# Patient Record
Sex: Female | Born: 1965 | Race: White | Hispanic: No | Smoking: Former smoker
Health system: Southern US, Community
[De-identification: ages and names within clinical notes are randomized; demographics above are authoritative.]

## PROBLEM LIST (undated history)

## (undated) DIAGNOSIS — I10 Essential (primary) hypertension: Secondary | ICD-10-CM

## (undated) DIAGNOSIS — F99 Mental disorder, not otherwise specified: Secondary | ICD-10-CM

## (undated) DIAGNOSIS — F320 Major depressive disorder, single episode, mild: Secondary | ICD-10-CM

## (undated) DIAGNOSIS — F419 Anxiety disorder, unspecified: Principal | ICD-10-CM

## (undated) HISTORY — DX: Major depressive disorder, single episode, mild: F32.0

## (undated) HISTORY — DX: Mental disorder, not otherwise specified: F99

## (undated) HISTORY — DX: Anxiety disorder, unspecified: F41.9

---

## 2000-11-14 ENCOUNTER — Other Ambulatory Visit: Admission: RE | Admit: 2000-11-14 | Discharge: 2000-11-14 | Payer: Self-pay | Admitting: Obstetrics and Gynecology

## 2003-10-26 ENCOUNTER — Ambulatory Visit (HOSPITAL_COMMUNITY): Admission: RE | Admit: 2003-10-26 | Discharge: 2003-10-26 | Payer: Self-pay | Admitting: Family Medicine

## 2005-01-23 ENCOUNTER — Inpatient Hospital Stay (HOSPITAL_COMMUNITY): Admission: RE | Admit: 2005-01-23 | Discharge: 2005-01-24 | Payer: Self-pay | Admitting: Obstetrics and Gynecology

## 2006-09-18 ENCOUNTER — Ambulatory Visit (HOSPITAL_COMMUNITY): Admission: RE | Admit: 2006-09-18 | Discharge: 2006-09-18 | Payer: Self-pay | Admitting: Obstetrics and Gynecology

## 2006-10-31 ENCOUNTER — Ambulatory Visit (HOSPITAL_COMMUNITY): Admission: RE | Admit: 2006-10-31 | Discharge: 2006-10-31 | Payer: Self-pay | Admitting: Obstetrics and Gynecology

## 2007-11-25 ENCOUNTER — Other Ambulatory Visit: Admission: RE | Admit: 2007-11-25 | Discharge: 2007-11-25 | Payer: Self-pay | Admitting: Family

## 2008-01-01 ENCOUNTER — Ambulatory Visit (HOSPITAL_COMMUNITY): Admission: RE | Admit: 2008-01-01 | Discharge: 2008-01-01 | Payer: Self-pay | Admitting: Obstetrics and Gynecology

## 2008-02-07 ENCOUNTER — Ambulatory Visit (HOSPITAL_COMMUNITY): Admission: RE | Admit: 2008-02-07 | Discharge: 2008-02-07 | Payer: Self-pay | Admitting: Family Medicine

## 2009-12-21 ENCOUNTER — Ambulatory Visit (HOSPITAL_COMMUNITY): Admission: RE | Admit: 2009-12-21 | Discharge: 2009-12-21 | Payer: Self-pay | Admitting: Obstetrics and Gynecology

## 2010-01-24 ENCOUNTER — Ambulatory Visit: Payer: Self-pay | Admitting: Orthopedic Surgery

## 2010-01-24 DIAGNOSIS — S93409A Sprain of unspecified ligament of unspecified ankle, initial encounter: Secondary | ICD-10-CM | POA: Insufficient documentation

## 2010-02-14 ENCOUNTER — Ambulatory Visit: Payer: Self-pay | Admitting: Orthopedic Surgery

## 2010-07-26 NOTE — Assessment & Plan Note (Signed)
Summary: ANKLE INJURY/NEEDS XRAY/CAF   Vital Signs:  Patient profile:   45 year old female Height:      67 inches Weight:      175 pounds Pulse rate:   80 / minute Resp:     16 per minute  Vitals Entered By: Fuller Canada MD (January 24, 2010 9:16 AM)  Visit Type:  new patient Referring Provider:  self Primary Provider:  Dorthey Sawyer  CC:  right ankle pain.  History of Present Illness: I saw Diamond [name] in the office today for an initial visit.  She is a 45 years old woman with the complaint of:  right ankle pain.  DOI 01/20/10.  Xrays today in our office.  Meds: Lexapro.  This is a 45 year old female who slid or fell down some steps on July 28 and twisted her RIGHT ankle.  She complains of severe pain which is constant sharp associated with significant amount of swelling and bruising over the foot.  She has pain numbness and tingling.  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 Cardiovascular:  Denies chest pain, palpitations, fainting, and murmurs. Respiratory:  Denies short of breath, wheezing, couch, tightness, pain on inspiration, and snoring . Gastrointestinal:  Denies heartburn, nausea, vomiting, diarrhea, constipation, and blood in your stools. Genitourinary:  Denies frequency, urgency, difficulty urinating, painful urination, flank pain, and bleeding in urine. Neurologic:  Denies numbness, tingling, unsteady gait, dizziness, tremors, and seizure. Musculoskeletal:  Denies joint pain, swelling, instability, stiffness, redness, heat, and muscle  pain. Endocrine:  Denies excessive thirst, exessive urination, and heat or cold intolerance. Psychiatric:  Denies nervousness, depression, anxiety, and hallucinations. Skin:  Denies changes in the skin, poor healing, rash, itching, and redness. HEENT:  Complains of blurred or double vision; denies eye pain, redness, and watering. Immunology:  Complains of seasonal allergies; denies sinus problems and allergic to bee stings. Hemoatologic:  Denies easy bleeding and brusing.  Physical Exam  Msk:  The patient is well developed and nourished, with normal grooming and hygiene. The body habitus is  normal Pulses:  pulses normal RIGHT foot Extremities:  this RIGHT foot is very swollen and there is significant ligament laxity anteriorly a medial lateral and pronation of the foot and supination of the foot.  Her primary tenderness is medial mild tenderness lateral foot and nontender fibula nontender proximally.  Passive range of motion less than 5 dorsiflexion 15 plantar flexion.  Weakness plantar flexion dorsiflexion.  Weakness to eversion inversion. Neurologic:  The coordination and sensation were normal  The reflexes were normal   Skin:  intact without lesions or rashes Psych:  alert and cooperative; normal mood and affect; normal attention span and concentration   Impression & Recommendations:  Problem # 1:  ANKLE SPRAIN (ICD-845.00) Assessment New  Orders: New Patient Level III (16109) Ankle x-ray complete,  minimum 3 views (60454) RIGHT  No fracture or subluxation dislocation is seen.  There are some small calcifications in the soft tissues around the lateral ankle but this does not appear to be acute.  There is some soft tissue swelling around the ankle joint.  Impression ankle  sprain  Medications Added to Medication List This Visit: 1)  Ibuprofen 800 Mg Tabs (Ibuprofen) .Marland Kitchen.. 1 by mouth three times a day 2)  Norco 5-325 Mg Tabs (Hydrocodone-acetaminophen) .Marland Kitchen.. 1 q 4 as needed  pain  Patient Instructions: 1)  diagnosis / ankle sprain 2)  Day 1-2  3)  apply ice for 20 minutes every 2 hours and keep the foot elevated and braced   4)  Use crutches keep your weight off your foot  5)  Day 3  6)  Try to put as much weight on your foot as possible in a brace;  7)  Wean yourself from the crutches. 8)  Move the foot up and down 25-50 times 3 -4x a day 9)  The brace should be worn for 3 weeks (if you are in a sport it should be worn for the rest of the season.  10)  Return in 3 weeks  Prescriptions: NORCO 5-325 MG TABS (HYDROCODONE-ACETAMINOPHEN) 1 q 4 as needed pain  #84 x 1   Entered and Authorized by:   Fuller Canada MD   Signed by:   Fuller Canada MD on 01/24/2010   Method used:   Print then Give to Patient   RxID:   [id number] IBUPROFEN 800 MG TABS (IBUPROFEN) 1 by mouth three times a day  #90 x 1   Entered and Authorized by:   Fuller Canada MD   Signed by:   Fuller Canada MD on 01/24/2010   Method used:   Print then Give to Patient   RxID:   [id number]

## 2010-07-26 NOTE — Letter (Signed)
Summary: History form  History form   Imported By: Jacklynn Ganong 01/24/2010 14:52:36  _____________________________________________________________________  External Attachment:    Type:   Image     Comment:   External Document

## 2010-07-26 NOTE — Assessment & Plan Note (Signed)
Summary: 3 wk reck rt ankle after brace/state bcbs/bsf   Visit Type:  Follow-up Referring Provider:  self Primary Provider:  Dorthey Sawyer  CC:  right ankle sprain.  History of Present Illness: The patient is 3 weeks status post injury to the medial aspect of the RIGHT ankle with a deltoid ligament sprain she was placed in a walking cam walker and has made major improvement  musculoskeletal system review normal complaints only related to the injury  DOI 01/20/10.  Meds: Lexapro.  On exam she is some mild tenderness on the medial deltoid ligament none laterally she has full range of motion in her knee is stable in internal and external stress including inversion eversion.  Remove brace followup as needed gradual return to activity  Allergies: No Known Drug Allergies   Impression & Recommendations:  Problem # 1:  ANKLE SPRAIN (ICD-845.00) Assessment Improved  Orders: Est. Patient Level III (13086)  Patient Instructions: 1)  remove brace f/u as needed

## 2010-11-11 NOTE — H&P (Signed)
Diamond [name], Diamond [name]                ACCOUNT NO.:  [id number]   MEDICAL RECORD NO.:  [id number]          PATIENT TYPE:  INP   LOCATION:  LDR1                          FACILITY:  [facility]   PHYSICIAN:  Tilda Burrow, M.D. DATE OF BIRTH:  1965-09-11   DATE OF ADMISSION:  01/23/2005  DATE OF DISCHARGE:  LH                                HISTORY & PHYSICAL   ADMISSION DIAGNOSES:  1.  Pregnancy at 38-2/7 weeks.  2.  Elective induction of labor.  3.  Advanced cervical favorability pelvic pressure.   HISTORY OF PRESENT ILLNESS:  This is a 45 year old female, G4, P2, AB1, LMP  uncertain with placing it in August, with ultrasound assigned [facility] February 04, 2005, by first ultrasound August 10, and August 6, by other first- and  second-trimester ultrasounds.  She is 38-2/7 weeks to 39 weeks by various  criteria.  She is admitted for elective induction of labor next Monday, January 23, 2005.   PRENATAL COURSE:  Notable for 10 prenatal visits with appropriate weight  gain and fundal height growth.  She has remained quite active in the  pregnancy.  Recently, the pelvic pressure has become significant and  cervical change already well in place.  She has dropped and vertex is low in  the pelvis with cervix 2-3 cm, 50% effaced, -2 with a soft, very stretchy,  mobile cervix.  Pros and cons of scheduled induction have been discussed  with the patient.  She desires and epidural.  Plans to breast-feed and  bottle supplementation.   PAST MEDICAL HISTORY:  Occasional migraine.   PAST SURGICAL HISTORY:  Negative.   ALLERGIES:  ENTEX LA and ORGANIDIN.   SOCIAL HISTORY:  Married.  Midwife.   PHYSICAL EXAMINATION:  GENERAL:  Healthy, well-tanned, Caucasian female.  VITAL SIGNS:  Weight 188 (5 pound weight gain in 1 week), blood pressure  104/60.  ABDOMEN:  Fundal height 37 cm down one from last week.  PELVIC:  Vertex presentation.  Fetal heart rate 140s.   PRENATAL LABORATORY DATA:  HIV,  GC, Chlamydia, RPR all negative.  MSAFP 670,  Group B Streptococcus negative.  Glucose tolerance test 120 mg percent.       JVF/MEDQ  D:  01/16/2005  T:  01/16/2005  Job:  [id number]   cc:   Corrie Mckusick, M.D.  Fax: [phone]

## 2010-11-11 NOTE — Op Note (Signed)
NAMEAERON, [name]                ACCOUNT NO.:  [id number]   MEDICAL RECORD NO.:  [id number]          PATIENT TYPE:  INP   LOCATION:  LDR1                          FACILITY:  [facility]   PHYSICIAN:  Tilda Burrow, M.D. DATE OF BIRTH:  1966/01/20   DATE OF PROCEDURE:  01/23/2005  DATE OF DISCHARGE:                                  PROCEDURE NOTE   DATE OF DELIVERY:  January 23, 2005, at 10:58 a.m.   Length of first stage of labor 2 hours and 50 minutes.  Length of second  stage of labor 8 minutes.  Length of third stage of labor 10 minutes.   DELIVERY NOTE:  [name] had a normal spontaneous vaginal delivery of a viable  female infant.  Upon delivery of head, nuchal cord was noted x1 which is  loosened and infant slipped through spontaneously without any difficulty.  The infant was thoroughly suctioned and dried, cord clamped and cut.  Upon  delivery, infant had good tone with movement of all extremities, pink and  strong cry.  The infant was passed off to mother's abdomen for newborn care  by nursing staff.  Third stage of labor was actually managed with 20 units  of Pitocin, 1000 mL of D5-LR at a rapid rate.  Placenta delivered  spontaneously via Schultze mechanism.  Upon inspection a three-vessel cord  is noted.  Perineum is also noted to be intact upon inspection.  Epidural  catheter was removed with blue tip_ intact.  Estimated blood loss was  approximately 300 cc.  The infant and mother stabilized and transferred out  to the postpartum unit in good condition.       DL/MEDQ  D:  16/03/9603  T:  01/23/2005  Job:  [id number]

## 2010-11-11 NOTE — Op Note (Signed)
NAMEERON, GOBLE                ACCOUNT NO.:  [id number]   MEDICAL RECORD NO.:  [id number]          PATIENT TYPE:  INP   LOCATION:  A413                          FACILITY:  [facility]   PHYSICIAN:  Lazaro Arms, M.D.   DATE OF BIRTH:  01/21/1966   DATE OF PROCEDURE:  01/23/2005  DATE OF DISCHARGE:  01/24/2005                                 OPERATIVE REPORT   Epidural note.   Velna is a 45 year old white female, gravida 3, para 2, admitted for  induction of labor. Cervix is favorable, and she is in active phase of labor  and is requesting an epidural be placed. The patient is placed in a sitting  position. Betadine prep was used after the L3-L4 interspace was identified.  A field drape was placed. One percent lidocaine was injected as a local  anesthetic. A 17-gauge Tuohy needle was used and a loss-of-resistance  technique employed. The epidural space was found with one pass without  difficulty. Ten cc of 0.125% bupivacaine plain is given as a test dose. An  additional 0.125% bupivacaine with 2 mcg/cc of fentanyl is given to dose up  the epidural through the epidural catheter. The catheter was taped 5 cm into  the epidural space, and a continuous infusion of 0.125% bupivacaine with 2  mcg/cc of fentanyl was begun. The patient was getting good pain relief. Her  blood pressure remained stable, and the fetal heart rate tracing was stable.      Lazaro Arms, M.D.  Electronically Signed     LHE/MEDQ  D:  03/29/2005  T:  03/29/2005  Job:  [id number]

## 2011-01-11 ENCOUNTER — Other Ambulatory Visit: Payer: Self-pay | Admitting: Obstetrics and Gynecology

## 2011-01-11 DIAGNOSIS — Z139 Encounter for screening, unspecified: Secondary | ICD-10-CM

## 2011-01-17 ENCOUNTER — Ambulatory Visit (HOSPITAL_COMMUNITY)
Admission: RE | Admit: 2011-01-17 | Discharge: 2011-01-17 | Disposition: A | Discharge status: Home / Self Care | Payer: BC Managed Care – PPO | Source: Ambulatory Visit | Attending: Obstetrics and Gynecology | Admitting: Obstetrics and Gynecology

## 2011-01-17 DIAGNOSIS — Z1231 Encounter for screening mammogram for malignant neoplasm of breast: Secondary | ICD-10-CM | POA: Insufficient documentation

## 2011-01-17 DIAGNOSIS — Z139 Encounter for screening, unspecified: Secondary | ICD-10-CM

## 2011-12-21 ENCOUNTER — Other Ambulatory Visit: Payer: Self-pay | Admitting: Obstetrics and Gynecology

## 2011-12-21 DIAGNOSIS — Z139 Encounter for screening, unspecified: Secondary | ICD-10-CM

## 2012-01-22 ENCOUNTER — Ambulatory Visit (HOSPITAL_COMMUNITY)
Admission: RE | Admit: 2012-01-22 | Discharge: 2012-01-22 | Disposition: A | Discharge status: Home / Self Care | Payer: BC Managed Care – PPO | Source: Ambulatory Visit | Attending: Obstetrics and Gynecology | Admitting: Obstetrics and Gynecology

## 2012-01-22 DIAGNOSIS — Z139 Encounter for screening, unspecified: Secondary | ICD-10-CM

## 2012-01-22 DIAGNOSIS — Z1231 Encounter for screening mammogram for malignant neoplasm of breast: Secondary | ICD-10-CM | POA: Insufficient documentation

## 2012-01-30 ENCOUNTER — Other Ambulatory Visit: Payer: Self-pay | Admitting: Adult Health

## 2012-01-30 ENCOUNTER — Other Ambulatory Visit (HOSPITAL_COMMUNITY)
Admission: RE | Admit: 2012-01-30 | Discharge: 2012-01-30 | Disposition: A | Discharge status: Home / Self Care | Payer: BC Managed Care – PPO | Source: Ambulatory Visit | Attending: Obstetrics and Gynecology | Admitting: Obstetrics and Gynecology

## 2012-01-30 DIAGNOSIS — Z1151 Encounter for screening for human papillomavirus (HPV): Secondary | ICD-10-CM | POA: Insufficient documentation

## 2012-01-30 DIAGNOSIS — Z01419 Encounter for gynecological examination (general) (routine) without abnormal findings: Secondary | ICD-10-CM | POA: Insufficient documentation

## 2012-02-08 ENCOUNTER — Other Ambulatory Visit (HOSPITAL_COMMUNITY): Payer: Self-pay | Admitting: Physician Assistant

## 2012-02-08 ENCOUNTER — Ambulatory Visit (HOSPITAL_COMMUNITY)
Admission: RE | Admit: 2012-02-08 | Discharge: 2012-02-08 | Disposition: A | Discharge status: Home / Self Care | Payer: BC Managed Care – PPO | Source: Ambulatory Visit | Attending: Physician Assistant | Admitting: Physician Assistant

## 2012-02-08 DIAGNOSIS — S2239XA Fracture of one rib, unspecified side, initial encounter for closed fracture: Secondary | ICD-10-CM | POA: Insufficient documentation

## 2012-02-08 DIAGNOSIS — W19XXXA Unspecified fall, initial encounter: Secondary | ICD-10-CM | POA: Insufficient documentation

## 2012-02-08 DIAGNOSIS — M25519 Pain in unspecified shoulder: Secondary | ICD-10-CM | POA: Insufficient documentation

## 2012-02-28 ENCOUNTER — Ambulatory Visit (INDEPENDENT_AMBULATORY_CARE_PROVIDER_SITE_OTHER): Payer: BC Managed Care – PPO | Admitting: Orthopedic Surgery

## 2012-02-28 ENCOUNTER — Encounter: Payer: Self-pay | Admitting: Orthopedic Surgery

## 2012-02-28 VITALS — BP 100/70 | Ht 67.5 in | Wt 178.0 lb

## 2012-02-28 DIAGNOSIS — S43109A Unspecified dislocation of unspecified acromioclavicular joint, initial encounter: Secondary | ICD-10-CM

## 2012-02-28 NOTE — Progress Notes (Signed)
Patient ID: Diamond [name], female   DOB: Oct 15, 1965, 46 y.o.   MRN: [id number] Chief Complaint  Patient presents with  . Shoulder Pain    Right shoulder pain, injured 3 weeks ago.      New problem  Referral from [facility]  The patient was injured approximately 3 weeks ago sustaining a fall landing backwards onto her right shoulder. She went to the doctor for evaluation an x-ray was obtained she is a second rib fracture and also has a grade 2 a.c. separation. She was treated with a sling appropriate pain medication and rest  She comes in with some discomfort in the shoulder but overall her symptoms are improving. Her pain is located over the right a.c. joint it is moderate but decreasing associated with range of motion.  Review of systems recorded and are unrelated.  Medical history recorded and reviewed  BP 100/70  Ht 5' 7.5" (1.715 m)  Wt 178 lb (80.74 kg)  BMI 27.47 kg/m2  LMP 01/25/2012  Physical Exam  Vitals reviewed. Constitutional: She is oriented to person, place, and time. She appears well-developed and well-nourished.  HENT:  Head: Normocephalic.  Neck: Normal range of motion. Neck supple.  Cardiovascular: Normal rate and intact distal pulses.   Pulmonary/Chest: No respiratory distress.  Musculoskeletal:       Ambulation normal. There is prominence of the right a.c. joint area versus the left with mild tenderness over the distal clavicle. Passive range of motion of the shoulders somewhat uncomfortable but normal. The shoulder is otherwise stable muscle strength and tone are normal skin is intact.  The left shoulder looks normal has full functional range of motion stability is normal strength is normal skin is intact    Neurological: She is alert and oriented to person, place, and time. She has normal reflexes.  Skin: Skin is warm and dry.  Psychiatric: She has a normal mood and affect. Thought content normal.     Impression after reviewing the  x-rays my independent interpretation is that she has an a.c. separation a second rib fracture  Report indicates same  Recommend progressive range of motion as tolerated limit heavy lifting for the next 3 weeks followup as needed

## 2012-02-28 NOTE — Patient Instructions (Signed)
Acromioclavicular Injuries  The AC (acromioclavicular) joint is the joint in the shoulder where the collarbone (clavicle) meets the shoulder blade (scapula). The part of the shoulder blade connected to the collarbone is called the acromion. Common problems with and treatments for the AC joint are detailed below.  ARTHRITIS  Arthritis occurs when the joint has been injured and the smooth padding between the joints (cartilage) is lost. This is the wear and tear seen in most joints of the body if they have been overused. This causes the joint to produce pain and swelling which is worse with activity.   AC JOINT SEPARATION  AC joint separation means that the ligaments connecting the acromion of the shoulder blade and collarbone have been damaged, and the two bones no longer line up. AC separations can be anywhere from mild to severe, and are "graded" depending upon which ligaments are torn and how badly they are torn.   Grade I Injury: the least damage is done, and the AC joint still lines up.   Grade II Injury: damage to the ligaments which reinforce the AC joint. In a Grade II injury, these ligaments are stretched but not entirely torn. When stressed, the AC joint becomes painful and unstable.   Grade III Injury: AC and secondary ligaments are completely torn, and the collarbone is no longer attached to the shoulder blade. This results in deformity; a prominence of the end of the clavicle.  AC JOINT FRACTURE  AC joint fracture means that there has been a break in the bones of the AC joint, usually the end of the clavicle.  TREATMENT  TREATMENT OF AC ARTHRITIS   There is currently no way to replace the cartilage damaged by arthritis. The best way to improve the condition is to decrease the activities which aggravate the problem. Application of ice to the joint helps decrease pain and soreness (inflammation). The use of non-steroidal anti-inflammatory medication is helpful.   If less conservative measures do not  work, then cortisone shots (injections) may be used. These are anti-inflammatories; they decrease the soreness in the joint and swelling.   If non-surgical measures fail, surgery may be recommended. The procedure is generally removal of a portion of the end of the clavicle. This is the part of the collarbone closest to your acromion which is stabilized with ligaments to the acromion of the shoulder blade. This surgery may be performed using a tube-like instrument with a light (arthroscope) for looking into a joint. It may also be performed as an open surgery through a small incision by the surgeon. Most patients will have good range of motion within 6 weeks and may return to all activity including sports by 8-12 weeks, barring complications.  TREATMENT OF AN AC SEPARATION   The initial treatment is to decrease pain. This is best accomplished by immobilizing the arm in a sling and placing an ice pack to the shoulder for 20 to 30 minutes every 2 hours as needed. As the pain starts to subside, it is important to begin moving the fingers, wrist, elbow and eventually the shoulder in order to prevent a stiff or "frozen" shoulder. Instruction on when and how much to move the shoulder will be provided by your caregiver. The length of time needed to regain full motion and function depends on the amount or grade of the injury. Recovery from a Grade I AC separation usually takes 10 to 14 days, whereas a Grade III may take 6 to 8 weeks.   Grade   Grade III injuries usually allow return to full activity with few restrictions. Treatment is also based on the activity demands of the injured shoulder. For example, a high level quarterback with an injured throwing arm will receive more aggressive treatment than someone with a desk job who rarely uses his/her arm for strenuous activities. In some cases, a painful lump may persist which could require a later surgery.  Surgery can be very successful, but the benefits must be weighed against the potential risks.  TREATMENT OF AN AC JOINT FRACTURE Fracture treatment depends on the type of fracture. Sometimes a splint or sling may be all that is required. Other times surgery may be required for repair. This is more frequently the case when the ligaments supporting the clavicle are completely torn. Your caregiver will help you with these decisions and together you can decide what will be the best treatment. HOME CARE INSTRUCTIONS    Apply ice to the injury for 15 to 20 minutes each hour while awake for 2 days. Put the ice in a plastic bag and place a towel between the bag of ice and skin.   If a sling has been applied, wear it constantly for as long as directed by your caregiver, even at night. The sling or splint can be removed for bathing or showering or as directed. Be sure to keep the shoulder in the same place as when the sling is on. Do not lift the arm.   If a figure-of-eight splint has been applied it should be tightened gently by another person every day. Tighten it enough to keep the shoulders held back. Allow enough room to place the index finger between the body and strap. Loosen the splint immediately if there is numbness or tingling in the hands.   Take over-the-counter or prescription medicines for pain, discomfort or fever as directed by your caregiver.   If you or your child has received a follow up appointment, it is very important to keep that appointment in order to avoid long term complications, chronic pain or disability.  SEEK MEDICAL CARE IF:    The pain is not relieved with medications.   There is increased swelling or discoloration that continues to get worse rather than better.   You or your child has been unable to follow up as instructed.   There is progressive numbness and tingling in the arm, forearm or hand.  SEEK IMMEDIATE MEDICAL CARE IF:    The arm is numb, cold or pale.    There is increasing pain in the hand, forearm or fingers.  MAKE SURE YOU:    Understand these instructions.   Will watch your condition.   Will get help right away if you are not doing well or get worse.  Document Released: 03/22/2005 Document Revised: 06/01/2011 Document Reviewed: 09/14/2008 [facility] Patient Information 2012 [place], [place].   No heavy lifting x 3 more weeks   Use arm as tolerated   Ok to remove the sling

## 2013-01-30 ENCOUNTER — Other Ambulatory Visit: Payer: Self-pay | Admitting: Adult Health

## 2014-02-25 ENCOUNTER — Other Ambulatory Visit: Payer: Self-pay | Admitting: Adult Health

## 2014-12-07 ENCOUNTER — Encounter: Payer: Self-pay | Admitting: Adult Health

## 2014-12-07 ENCOUNTER — Ambulatory Visit (INDEPENDENT_AMBULATORY_CARE_PROVIDER_SITE_OTHER): Payer: BC Managed Care – PPO | Admitting: Adult Health

## 2014-12-07 VITALS — BP 140/70 | HR 92 | Ht 67.0 in | Wt 168.5 lb

## 2014-12-07 DIAGNOSIS — F329 Major depressive disorder, single episode, unspecified: Secondary | ICD-10-CM

## 2014-12-07 DIAGNOSIS — F32A Depression, unspecified: Secondary | ICD-10-CM

## 2014-12-07 DIAGNOSIS — F419 Anxiety disorder, unspecified: Secondary | ICD-10-CM | POA: Insufficient documentation

## 2014-12-07 DIAGNOSIS — F320 Major depressive disorder, single episode, mild: Secondary | ICD-10-CM

## 2014-12-07 HISTORY — DX: Depression, unspecified: F32.A

## 2014-12-07 HISTORY — DX: Major depressive disorder, single episode, mild: F32.0

## 2014-12-07 HISTORY — DX: Anxiety disorder, unspecified: F41.9

## 2014-12-07 MED ORDER — BUSPIRONE HCL 5 MG PO TABS: 5.0000 mg | ORAL_TABLET | Freq: Two times a day (BID) | ORAL | Status: DC

## 2014-12-07 NOTE — Progress Notes (Signed)
Subjective:     Patient ID: Diamond [name], female   DOB: [birth year], 49 y.o.   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 She says he has been different since his Mom died.  Review of Systems Patient denies any headaches, hearing loss, fatigue, blurred vision, shortness of breath, chest pain, abdominal pain, problems with bowel movements, urination, or intercourse. No joint pain or mood swings.See HPI for positives.  Reviewed past medical,surgical, social and family history. Reviewed medications and allergies.     Objective:   Physical Exam BP 140/70 mmHg  Pulse 92  Ht 5\' 7"  (1.702 m)  Wt 168 lb 8 oz (76.431 kg)  BMI 26.38 kg/m2  LMP 11/08/2014  PHQ 9 score is 8, she is teary today, discussed talking with husband, and do not drink, will add buspar to celexa to see if helps, offered counseling and she declines for now.She asked about xanax, but do not think that is good choice for now.She is aware it can take 2 weeks before medicine kicks in. Take time for self.  Face time 15 minutes.    Assessment:     Anxiety    Mild depression  Plan:    Review handout on buspar Continue celexa Rx buspar 5 mg #60 take 1 bid with 1 refill No alcohol  Follow up in 4 weeks,call before if needed

## 2014-12-07 NOTE — Patient Instructions (Signed)
Buspirone tablets  Follow up in 4 weeks What is this medicine? BUSPIRONE (byoo SPYE rone) is used to treat anxiety disorders. This medicine may be used for other purposes; ask your health care provider or pharmacist if you have questions. COMMON BRAND NAME(S): BuSpar What should I tell my health care provider before I take this medicine? They need to know if you have any of these conditions: -kidney or liver disease -an unusual or allergic reaction to buspirone, other medicines, foods, dyes, or preservatives -pregnant or trying to get pregnant -breast-feeding How should I use this medicine? Take this medicine by mouth with a glass of water. Follow the directions on the prescription label. You may take this medicine with or without food. To ensure that this medicine always works the same way for you, you should take it either always with or always without food. Take your doses at regular intervals. Do not take your medicine more often than directed. Do not stop taking except on the advice of your doctor or health care professional. Talk to your pediatrician regarding the use of this medicine in children. Special care may be needed. Overdosage: If you think you have taken too much of this medicine contact a poison control center or emergency room at once. NOTE: This medicine is only for you. Do not share this medicine with others. What if I miss a dose? If you miss a dose, take it as soon as you can. If it is almost time for your next dose, take only that dose. Do not take double or extra doses. What may interact with this medicine? Do not take this medicine with any of the following medications: -linezolid -MAOIs like Carbex, Eldepryl, Marplan, Nardil, and Parnate -methylene blue -procarbazine This medicine may also interact with the following medications: -diazepam -digoxin -diltiazem -erythromycin -grapefruit juice -haloperidol -medicines for mental depression or mood  problems -medicines for seizures like carbamazepine, phenobarbital and phenytoin -nefazodone -other medications for anxiety -rifampin -ritonavir -some antifungal medicines like itraconazole, ketoconazole, and voriconazole -verapamil -warfarin This list may not describe all possible interactions. Give your health care provider a list of all the medicines, herbs, non-prescription drugs, or dietary supplements you use. Also tell them if you smoke, drink alcohol, or use illegal drugs. Some items may interact with your medicine. What should I watch for while using this medicine? Visit your doctor or health care professional for regular checks on your progress. It may take 1 to 2 weeks before your anxiety gets better. You may get drowsy or dizzy. Do not drive, use machinery, or do anything that needs mental alertness until you know how this drug affects you. Do not stand or sit up quickly, especially if you are an older patient. This reduces the risk of dizzy or fainting spells. Alcohol can make you more drowsy and dizzy. Avoid alcoholic drinks. What side effects may I notice from receiving this medicine? Side effects that you should report to your doctor or health care professional as soon as possible: -blurred vision or other vision changes -chest pain -confusion -difficulty breathing -feelings of hostility or anger -muscle aches and pains -numbness or tingling in hands or feet -ringing in the ears -skin rash and itching -vomiting -weakness Side effects that usually do not require medical attention (report to your doctor or health care professional if they continue or are bothersome): -disturbed dreams, nightmares -headache -nausea -restlessness or nervousness -sore throat and nasal congestion -stomach upset This list may not describe all possible side effects. Call your  doctor for medical advice about side effects. You may report side effects to FDA at 1-800-FDA-1088. Where should I  keep my medicine? Keep out of the reach of children. Store at room temperature below 30 degrees C (86 degrees F). Protect from light. Keep container tightly closed. Throw away any unused medicine after the expiration date. NOTE: This sheet is a summary. It may not cover all possible information. If you have questions about this medicine, talk to your doctor, pharmacist, or health care provider.  2015, Elsevier/Gold Standard. (2010-01-20 18:06:11)

## 2015-01-04 ENCOUNTER — Ambulatory Visit: Payer: BC Managed Care – PPO | Admitting: Adult Health

## 2015-04-14 ENCOUNTER — Other Ambulatory Visit: Payer: Self-pay | Admitting: Adult Health

## 2015-07-15 ENCOUNTER — Other Ambulatory Visit: Payer: Self-pay | Admitting: Adult Health

## 2016-03-23 ENCOUNTER — Other Ambulatory Visit: Payer: Self-pay | Admitting: Adult Health

## 2016-08-30 ENCOUNTER — Emergency Department (HOSPITAL_COMMUNITY)
Admission: EM | Admit: 2016-08-30 | Discharge: 2016-08-31 | Disposition: A | Discharge status: Home / Self Care | Payer: BC Managed Care – PPO | Attending: Emergency Medicine | Admitting: Emergency Medicine

## 2016-08-30 ENCOUNTER — Encounter (HOSPITAL_COMMUNITY): Payer: Self-pay | Admitting: *Deleted

## 2016-08-30 DIAGNOSIS — Y939 Activity, unspecified: Secondary | ICD-10-CM | POA: Diagnosis not present

## 2016-08-30 DIAGNOSIS — Z87891 Personal history of nicotine dependence: Secondary | ICD-10-CM | POA: Diagnosis not present

## 2016-08-30 DIAGNOSIS — Y999 Unspecified external cause status: Secondary | ICD-10-CM | POA: Insufficient documentation

## 2016-08-30 DIAGNOSIS — W1839XA Other fall on same level, initial encounter: Secondary | ICD-10-CM | POA: Diagnosis not present

## 2016-08-30 DIAGNOSIS — Y929 Unspecified place or not applicable: Secondary | ICD-10-CM | POA: Insufficient documentation

## 2016-08-30 DIAGNOSIS — S0990XA Unspecified injury of head, initial encounter: Secondary | ICD-10-CM | POA: Diagnosis present

## 2016-08-30 DIAGNOSIS — Z23 Encounter for immunization: Secondary | ICD-10-CM | POA: Insufficient documentation

## 2016-08-30 DIAGNOSIS — S0083XA Contusion of other part of head, initial encounter: Secondary | ICD-10-CM

## 2016-08-30 DIAGNOSIS — S0181XA Laceration without foreign body of other part of head, initial encounter: Secondary | ICD-10-CM | POA: Insufficient documentation

## 2016-08-30 MED ORDER — LIDOCAINE-EPINEPHRINE (PF) 1 %-1:200000 IJ SOLN
INTRAMUSCULAR | Status: AC
  Administered 2016-08-31
  Filled 2016-08-30: qty 30

## 2016-08-30 MED ORDER — LIDOCAINE-EPINEPHRINE 1 %-1:100000 IJ SOLN
20.0000 mL | INTRAMUSCULAR | Status: DC
  Filled 2016-08-30: qty 20

## 2016-08-30 NOTE — ED Notes (Signed)
ED Provider at bedside for suturing.  

## 2016-08-30 NOTE — ED Triage Notes (Signed)
Pt c/o laceration to forehead after falling

## 2016-08-31 MED ORDER — TETANUS-DIPHTH-ACELL PERTUSSIS 5-2.5-18.5 LF-MCG/0.5 IM SUSP
0.5000 mL | Freq: Once | INTRAMUSCULAR | Status: AC
  Administered 2016-08-31: 0.5 mL via INTRAMUSCULAR
  Filled 2016-08-31: qty 0.5

## 2016-08-31 NOTE — ED Provider Notes (Signed)
[facility]EM[facility]ENCY DEPT Provider Note   CSN: [id number] Arrival date & time: 08/30/16  2140     History   Chief Complaint Chief Complaint  Patient presents with  . Fall    HPI Diamond [name] is a 51 y.o. female.  Patient is a 51 year old female who presents to the emergency department for laceration to the 4 head and contusion to the 4 head following a fall.  The patient states that she lost her footing and fell just prior to arrival in the emergency department. She sustained a laceration to the 4 head. She states that there was a lot of bleeding, but she was gradually able to get it controlled with applying pressure. She did not lose consciousness. She denies any loss of consciousness. Was no loss of any bowel or bladder function. 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 The patient denies being on any anticoagulation medications.   The history is provided by the patient.    Past Medical History:  Diagnosis Date  . Anxiety 12/07/2014  . Mental disorder   . 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 Anxiety 12/07/2014  . 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 ANKLE SPRAIN 01/24/2010    History reviewed. No pertinent surgical history.  OB History    Gravida Para Term Preterm AB Living   3 3           SAB TAB Ectopic Multiple Live Births                   Home Medications    Prior to Admission medications   Medication Sig Start Date End Date Taking? Authorizing Provider  busPIRone (BUSPAR) 5 MG tablet Take 1 tablet (5 mg total) by mouth 2 (two) times daily. 12/07/14   Adline Potter, NP  citalopram (CELEXA) 40 MG tablet TAKE ONE TABLET BY MOUTH ONCE DAILY. 03/27/16   Adline Potter, NP    Family History Family History  Problem Relation Age of Onset  . Diabetes Paternal Grandmother     Social History Social History  Substance Use Topics  . Smoking status: Former  Smoker    Types: Cigarettes  . Smokeless tobacco: Never Used  . Alcohol use Yes     Comment: twice a week     Allergies   Entex lq [phenylephrine-guaifenesin]; Keflex [cephalexin]; and Organidin [glycerol, iodinated]   Review of Systems Review of Systems  Psychiatric/Behavioral: The patient is nervous/anxious.   All other systems reviewed and are negative.    Physical Exam Updated Vital Signs BP 150/83 (BP Location: Right Arm)   Pulse 82   Temp 97.8 F (36.6 C) (Oral)   Resp 18   Ht 5' 7.5" (1.715 m)   Wt 72.6 kg   LMP 11/08/2014   SpO2 96%   BMI 24.69 kg/m   Physical Exam  Constitutional: She is oriented to person, place, and time. She appears well-developed and well-nourished.  Non-toxic appearance.  HENT:  Head: Normocephalic.    Right Ear: Tympanic membrane and external ear normal.  Left Ear: Tympanic membrane and external ear normal.  Eyes: EOM and lids are normal. Pupils are equal, round, and reactive to light.  Neck: Normal range of motion. Neck supple. Carotid bruit is not present.  Cardiovascular: Normal rate, regular rhythm, normal heart sounds, intact distal pulses and normal pulses.   Pulmonary/Chest: Breath sounds normal. No  respiratory distress.  Abdominal: Soft. Bowel sounds are normal. There is no tenderness. There is no guarding.  Musculoskeletal: Normal range of motion.  Lymphadenopathy:       Head (right side): No submandibular adenopathy present.       Head (left side): No submandibular adenopathy present.    She has no cervical adenopathy.  Neurological: She is alert and oriented to person, place, and time. She has normal strength. No cranial nerve deficit or sensory deficit. Coordination and gait normal. GCS eye subscore is 4. GCS verbal subscore is 5. GCS motor subscore is 6.  Skin: Skin is warm and dry.  Psychiatric: She has a normal mood and affect. Her speech is normal.  Nursing note and vitals reviewed.    ED Treatments / Results    Labs (all labs ordered are listed, but only abnormal results are displayed) Labs Reviewed - No data to display  EKG  EKG Interpretation None       Radiology No results found.  Procedures .Marland Kitchen.Laceration Repair Date/Time: 08/31/2016 12:18 AM Performed by: Ivery QualeBRYANT, Tarea Skillman Authorized by: Ivery QualeBRYANT, Harmonii Karle   Consent:    Consent obtained:  Verbal   Consent given by:  Patient   Risks discussed:  Infection, pain and poor cosmetic result Anesthesia (see MAR for exact dosages):    Anesthesia method:  Local infiltration   Local anesthetic:  Lidocaine 1% WITH epi Laceration details:    Location:  Face   Face location:  Forehead   Length (cm):  3.3 Repair type:    Repair type:  Simple Pre-procedure details:    Preparation:  Patient was prepped and draped in usual sterile fashion Exploration:    Hemostasis achieved with:  Direct pressure   Wound extent: no foreign bodies/material noted     Contaminated: no   Treatment:    Area cleansed with:  Betadine   Irrigation solution:  Sterile saline Skin repair:    Repair method:  Sutures   Suture size:  5-0   Suture material:  Nylon   Number of sutures:  9 Approximation:    Approximation:  Close Post-procedure details:    Dressing:  Non-adherent dressing   Patient tolerance of procedure:  Tolerated well, no immediate complications   (including critical care time)  Medications Ordered in ED Medications  lidocaine-EPINEPHrine (XYLOCAINE W/EPI) 1 %-1:100000 (with pres) injection 20 mL (not administered)  lidocaine-EPINEPHrine (XYLOCAINE-EPINEPHrine) 1 %-1:200000 (PF) injection (not administered)  Tdap (BOOSTRIX) injection 0.5 mL (not administered)     Initial Impression / Assessment and Plan / ED Course  I have reviewed the triage vital signs and the nursing notes.  Pertinent labs & imaging results that were available during my care of the patient were reviewed by me and considered in my medical decision making (see chart for  details).     **I have reviewed nursing notes, vital signs, and all appropriate lab and imaging results for this patient.*  Final Clinical Impressions(s) / ED Diagnoses   Final diagnoses:  Forehead laceration, initial encounter  Forehead contusion, initial encounter    New Prescriptions New Prescriptions   No medications on file     Ivery QualeHobson Manu Rubey, PA-C 09/01/16 0109    Glynn OctaveStephen Rancour, MD 09/01/16 [phone]

## 2016-08-31 NOTE — Discharge Instructions (Signed)
Please use an ice pack to your contusion/laceration area tonight. Please use a bandage to the area until the sutures are removed. Protect the sutured area during shower. Use Tylenol or ibuprofen for soreness. Please have the sutures removed in 5 days. Return to the emergency department sooner if any signs of advancing infection.

## 2017-04-19 ENCOUNTER — Ambulatory Visit: Payer: BC Managed Care – PPO | Admitting: Adult Health

## 2017-05-24 ENCOUNTER — Telehealth: Payer: Self-pay | Admitting: Adult Health

## 2017-05-24 NOTE — Telephone Encounter (Signed)
Going through separation, needs something, appt made for Monday at 4 pm

## 2017-05-28 ENCOUNTER — Encounter: Payer: Self-pay | Admitting: Adult Health

## 2017-05-28 ENCOUNTER — Ambulatory Visit: Payer: BC Managed Care – PPO | Admitting: Adult Health

## 2017-05-28 VITALS — BP 140/100 | HR 92 | Ht 67.0 in | Wt 157.0 lb

## 2017-05-28 DIAGNOSIS — F419 Anxiety disorder, unspecified: Secondary | ICD-10-CM

## 2017-05-28 DIAGNOSIS — I10 Essential (primary) hypertension: Secondary | ICD-10-CM | POA: Diagnosis not present

## 2017-05-28 MED ORDER — ESCITALOPRAM OXALATE 10 MG PO TABS: 10.0000 mg | ORAL_TABLET | Freq: Every day | ORAL | 6 refills | Status: DC

## 2017-05-28 MED ORDER — HYDROCHLOROTHIAZIDE 25 MG PO TABS: 25.0000 mg | ORAL_TABLET | Freq: Every day | ORAL | 6 refills | Status: DC

## 2017-05-28 NOTE — Progress Notes (Signed)
Subjective:     Patient ID: Diamond [name], female   DOB: 11/12/1965, 51 y.o.   MRN: [id number]  HPI Diamond [name] is a 51 year old white female, separated in complaining of anxiety, is going to attorney tomorrow regarding separation.   Review of Systems +anxiety  Reviewed past medical,surgical, social and family history. Reviewed medications and allergies.     Objective:   Physical Exam BP (!) 140/100 (BP Location: Right Arm, Cuff Size: Normal)   Pulse 92   Ht 5\' 7"  (1.702 m)   Wt 157 lb (71.2 kg)   LMP 11/08/2014   BMI 24.59 kg/m    Skin warm and dry.  Lungs: clear to ausculation bilaterally. Cardiovascular: regular rate and rhythm. PHQ 9 score 5, denies being suicidal or homicidal just stressed.Has not had pap and physical or mammogram in 5 years.Discussed taking care of herself now.   Assessment:     1. Anxiety   2. Essential hypertension       Plan:     Meds ordered this encounter  Medications  . escitalopram (LEXAPRO) 10 MG tablet    Sig: Take 1 tablet (10 mg total) by mouth daily.    Dispense:  30 tablet    Refill:  6    Order Specific Question:   Supervising Provider    Answer:   Despina HiddenEURE, LUTHER H [[id number]]  . hydrochlorothiazide (HYDRODIURIL) 25 MG tablet    Sig: Take 1 tablet (25 mg total) by mouth daily.    Dispense:  30 tablet    Refill:  6    Order Specific Question:   Supervising Provider    Answer:   Duane LopeEURE, LUTHER H [[id number]]  F/U in 4 weeks for pap and physical and labs Get mammogram

## 2017-06-25 ENCOUNTER — Other Ambulatory Visit (HOSPITAL_COMMUNITY)
Admission: RE | Admit: 2017-06-25 | Discharge: 2017-06-25 | Disposition: A | Discharge status: Home / Self Care | Payer: BC Managed Care – PPO | Source: Ambulatory Visit | Attending: Adult Health | Admitting: Adult Health

## 2017-06-25 ENCOUNTER — Other Ambulatory Visit: Payer: BC Managed Care – PPO

## 2017-06-25 ENCOUNTER — Encounter: Payer: Self-pay | Admitting: Adult Health

## 2017-06-25 ENCOUNTER — Ambulatory Visit: Payer: BC Managed Care – PPO | Admitting: Adult Health

## 2017-06-25 VITALS — BP 120/78 | HR 73 | Resp 18 | Ht 67.0 in | Wt 154.0 lb

## 2017-06-25 DIAGNOSIS — Z01419 Encounter for gynecological examination (general) (routine) without abnormal findings: Secondary | ICD-10-CM | POA: Diagnosis not present

## 2017-06-25 DIAGNOSIS — Z131 Encounter for screening for diabetes mellitus: Secondary | ICD-10-CM

## 2017-06-25 DIAGNOSIS — Z1329 Encounter for screening for other suspected endocrine disorder: Secondary | ICD-10-CM

## 2017-06-25 DIAGNOSIS — F419 Anxiety disorder, unspecified: Secondary | ICD-10-CM

## 2017-06-25 DIAGNOSIS — Z01411 Encounter for gynecological examination (general) (routine) with abnormal findings: Secondary | ICD-10-CM | POA: Diagnosis not present

## 2017-06-25 DIAGNOSIS — Z1211 Encounter for screening for malignant neoplasm of colon: Secondary | ICD-10-CM

## 2017-06-25 DIAGNOSIS — Z1212 Encounter for screening for malignant neoplasm of rectum: Secondary | ICD-10-CM | POA: Diagnosis not present

## 2017-06-25 DIAGNOSIS — I10 Essential (primary) hypertension: Secondary | ICD-10-CM | POA: Insufficient documentation

## 2017-06-25 LAB — HEMOCCULT GUIAC POC 1CARD (OFFICE): FECAL OCCULT BLD: NEGATIVE

## 2017-06-25 NOTE — Progress Notes (Signed)
Patient ID: Diamond [name], female   DOB: 05/06/1966, 51 y.o.   MRN: [id number] History of Present Illness: Diamond [name] is a 51 year old white female, separated, PM in for well woman gyn exam and pap. PCP is Dr Phillips OdorGolding.    Current Medications, Allergies, Past Medical History, Past Surgical History, Family History and Social History were reviewed in [facility] electronic medical record.     Review of Systems:  Patient denies any headaches, hearing loss, fatigue, blurred vision, shortness of breath, chest pain, abdominal pain, problems with bowel movements, urination, or intercourse(not having sex). No joint pain or mood swings.   Physical Exam:BP 120/78 (BP Location: Left Arm, Patient Position: Sitting, Cuff Size: Normal)   Pulse 73   Resp 18   Ht 5\' 7"  (1.702 m)   Wt 154 lb (69.9 kg)   LMP 11/08/2014   BMI 24.12 kg/m  General:  Well developed, well nourished, no acute distress Skin:  Warm and dry Neck:  Midline trachea, normal thyroid, good ROM, no lymphadenopathy Lungs; Clear to auscultation bilaterally Breast:  No dominant palpable mass, retraction, or nipple discharge Cardiovascular: Regular rate and rhythm Abdomen:  Soft, non tender, no hepatosplenomegaly Pelvic:  External genitalia is normal in appearance, no lesions.  The vagina is normal in appearance. Urethra has no lesions or masses. The cervix is bulbous.  Uterus is felt to be normal size, shape, and contour.  No adnexal masses or tenderness noted.Bladder is non tender, no masses felt. Rectal: Good sphincter tone, no polyps, or hemorrhoids felt.  Hemoccult negative. Extremities/musculoskeletal:  No swelling or varicosities noted, no clubbing or cyanosis Psych:  No mood changes, alert and cooperative,seems happy PHQ 2 score 0.PHQ 9 score was 5 on 05/28/17, and she says she feels better on Lexapro.   Impression:  1. Encounter for gynecological examination with Papanicolaou smear of cervix   2. Anxiety   3. Essential  hypertension   4. Screening for colorectal cancer      Plan: Check CBC,CMP,TSH and lipids Continue HCTZ and lexapro F/U in 6 weeks for BP and med check Physical in 1 year Pap in 3 if normal Get mammogram now and year;y Referred to Dr Karilyn Cotaehman for colonoscopy

## 2017-06-26 LAB — LIPID PANEL
CHOL/HDL RATIO: 1.6 ratio (ref 0.0–4.4)
Cholesterol, Total: 235 mg/dL — ABNORMAL HIGH (ref 100–199)
HDL: 146 mg/dL (ref >39)
LDL Calculated: 78 mg/dL (ref 0–99)
Triglycerides: 54 mg/dL (ref 0–149)
VLDL Cholesterol Cal: 11 mg/dL (ref 5–40)

## 2017-06-26 LAB — COMPREHENSIVE METABOLIC PANEL
A/G RATIO: 2 (ref 1.2–2.2)
ALBUMIN: 5 g/dL (ref 3.5–5.5)
ALK PHOS: 44 IU/L (ref 39–117)
ALT: 29 IU/L (ref 0–32)
AST: 45 IU/L — ABNORMAL HIGH (ref 0–40)
BILIRUBIN TOTAL: 1.3 mg/dL — AB (ref 0.0–1.2)
BUN / CREAT RATIO: 11 (ref 9–23)
BUN: 8 mg/dL (ref 6–24)
CHLORIDE: 98 mmol/L (ref 96–106)
CO2: 22 mmol/L (ref 20–29)
Calcium: 9.8 mg/dL (ref 8.7–10.2)
Creatinine, Ser: 0.71 mg/dL (ref 0.57–1.00)
GFR calc Af Amer: 114 mL/min/{1.73_m2} (ref >59)
GFR calc non Af Amer: 99 mL/min/{1.73_m2} (ref >59)
GLOBULIN, TOTAL: 2.5 g/dL (ref 1.5–4.5)
Glucose: 103 mg/dL — ABNORMAL HIGH (ref 65–99)
Potassium: 4.4 mmol/L (ref 3.5–5.2)
SODIUM: 140 mmol/L (ref 134–144)
Total Protein: 7.5 g/dL (ref 6.0–8.5)

## 2017-06-26 LAB — CBC
HEMATOCRIT: 45.5 % (ref 34.0–46.6)
HEMOGLOBIN: 15.3 g/dL (ref 11.1–15.9)
MCH: 32.7 pg (ref 26.6–33.0)
MCHC: 33.6 g/dL (ref 31.5–35.7)
MCV: 97 fL (ref 79–97)
Platelets: 218 10*3/uL (ref 150–379)
RBC: 4.68 x10E6/uL (ref 3.77–5.28)
RDW: 13.2 % (ref 12.3–15.4)
WBC: 6.1 10*3/uL (ref 3.4–10.8)

## 2017-06-26 LAB — TSH: TSH: 1.35 u[IU]/mL (ref 0.450–4.500)

## 2017-06-27 ENCOUNTER — Encounter (INDEPENDENT_AMBULATORY_CARE_PROVIDER_SITE_OTHER): Payer: Self-pay | Admitting: *Deleted

## 2017-06-28 ENCOUNTER — Telehealth: Payer: Self-pay | Admitting: Adult Health

## 2017-06-28 LAB — CYTOLOGY - PAP
Adequacy: ABSENT
DIAGNOSIS: NEGATIVE
HPV (WINDOPATH): NOT DETECTED

## 2017-06-28 NOTE — Telephone Encounter (Signed)
Pt aware of labs, they are good but decrease alcohol

## 2017-08-06 ENCOUNTER — Ambulatory Visit: Payer: BC Managed Care – PPO | Admitting: Adult Health

## 2018-01-23 ENCOUNTER — Ambulatory Visit: Payer: BC Managed Care – PPO | Admitting: Adult Health

## 2018-01-23 ENCOUNTER — Encounter (INDEPENDENT_AMBULATORY_CARE_PROVIDER_SITE_OTHER): Payer: Self-pay

## 2018-01-23 ENCOUNTER — Encounter: Payer: Self-pay | Admitting: Adult Health

## 2018-01-23 VITALS — BP 126/73 | HR 63 | Ht 67.0 in | Wt 160.0 lb

## 2018-01-23 DIAGNOSIS — F419 Anxiety disorder, unspecified: Secondary | ICD-10-CM

## 2018-01-23 DIAGNOSIS — I10 Essential (primary) hypertension: Secondary | ICD-10-CM

## 2018-01-23 MED ORDER — HYDROCHLOROTHIAZIDE 25 MG PO TABS: 25.0000 mg | ORAL_TABLET | Freq: Every day | ORAL | 6 refills | Status: DC

## 2018-01-23 MED ORDER — ESCITALOPRAM OXALATE 10 MG PO TABS: 10.0000 mg | ORAL_TABLET | Freq: Every day | ORAL | 6 refills | Status: DC

## 2018-01-23 NOTE — Addendum Note (Signed)
Addended by: Cyril MourningGRIFFIN, Fern Asmar A on: 01/23/2018 11:43 AM   Modules accepted: Orders

## 2018-01-23 NOTE — Progress Notes (Signed)
  Subjective:     Patient ID: Jenell Millinerndrea C Pricer, female   DOB: 05/29/1966, 52 y.o.   MRN: [id number]  HPI Sue Lushndrea is a 52 year old white female, married, in for medication refill, no complaints, doing good. 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 PCP is Dr Phillips OdorGolding.   Review of Systems Patient denies any headaches, hearing loss, fatigue, blurred vision, shortness of breath, chest pain, abdominal pain, problems with bowel movements, urination, or intercourse. No joint pain or mood swings. Reviewed past medical,surgical, social and family history. Reviewed medications and allergies.     Objective:   Physical Exam BP 126/73 (BP Location: Left Arm, Patient Position: Sitting, Cuff Size: Normal)   Pulse 63   Ht 5\' 7"  (1.702 m)   Wt 160 lb (72.6 kg)   LMP 11/08/2014   BMI 25.06 kg/m  Skin warm and dry. Lungs: clear to ausculation bilaterally. Cardiovascular: regular rate and rhythm.   PHQ 9 score 0.  Assessment:     1. Essential hypertension   2. Anxiety       Plan:     Meds ordered this encounter  Medications  . escitalopram (LEXAPRO) 10 MG tablet    Sig: Take 1 tablet (10 mg total) by mouth daily.    Dispense:  30 tablet    Refill:  6    Order Specific Question:   Supervising Provider    Answer:   Despina HiddenEURE, LUTHER H [[id number]]  . hydrochlorothiazide (HYDRODIURIL) 25 MG tablet    Sig: Take 1 tablet (25 mg total) by mouth daily.    Dispense:  30 tablet    Refill:  6    Order Specific Question:   Supervising Provider    Answer:   Lazaro ArmsEURE, LUTHER H [[id number]]  Physical in 6 months

## 2018-03-18 ENCOUNTER — Encounter (INDEPENDENT_AMBULATORY_CARE_PROVIDER_SITE_OTHER): Payer: Self-pay | Admitting: *Deleted

## 2018-03-20 ENCOUNTER — Other Ambulatory Visit (HOSPITAL_COMMUNITY): Payer: Self-pay | Admitting: Physician Assistant

## 2018-03-20 DIAGNOSIS — Z1239 Encounter for other screening for malignant neoplasm of breast: Secondary | ICD-10-CM

## 2018-08-06 ENCOUNTER — Ambulatory Visit (HOSPITAL_COMMUNITY)
Admission: RE | Admit: 2018-08-06 | Discharge: 2018-08-06 | Disposition: A | Discharge status: Home / Self Care | Payer: BC Managed Care – PPO | Source: Ambulatory Visit | Attending: Family Medicine | Admitting: Family Medicine

## 2018-08-06 ENCOUNTER — Other Ambulatory Visit (HOSPITAL_COMMUNITY): Payer: Self-pay | Admitting: Family Medicine

## 2018-08-06 ENCOUNTER — Ambulatory Visit (HOSPITAL_COMMUNITY): Admission: RE | Admit: 2018-08-06 | Payer: BC Managed Care – PPO | Source: Ambulatory Visit

## 2018-08-06 ENCOUNTER — Other Ambulatory Visit: Payer: Self-pay | Admitting: Family Medicine

## 2018-08-06 DIAGNOSIS — R1011 Right upper quadrant pain: Secondary | ICD-10-CM

## 2018-08-06 DIAGNOSIS — R1110 Vomiting, unspecified: Secondary | ICD-10-CM

## 2018-08-06 DIAGNOSIS — R1111 Vomiting without nausea: Secondary | ICD-10-CM

## 2018-08-06 MED ORDER — IOHEXOL 300 MG/ML  SOLN
100.0000 mL | Freq: Once | INTRAMUSCULAR | Status: AC | PRN
  Administered 2018-08-06: 100 mL via INTRAVENOUS

## 2018-08-06 MED ORDER — IOPAMIDOL (ISOVUE-300) INJECTION 61%
50.0000 mL | Freq: Once | INTRAVENOUS | Status: AC | PRN
  Administered 2018-08-06: 30 mL via ORAL

## 2018-08-15 ENCOUNTER — Encounter: Payer: Self-pay | Admitting: Internal Medicine

## 2018-08-20 ENCOUNTER — Other Ambulatory Visit: Payer: Self-pay | Admitting: Adult Health

## 2018-10-22 ENCOUNTER — Encounter: Payer: Self-pay | Admitting: Gastroenterology

## 2018-10-22 ENCOUNTER — Ambulatory Visit (INDEPENDENT_AMBULATORY_CARE_PROVIDER_SITE_OTHER): Payer: BC Managed Care – PPO | Admitting: Gastroenterology

## 2018-10-22 ENCOUNTER — Other Ambulatory Visit: Payer: Self-pay

## 2018-10-22 ENCOUNTER — Telehealth: Payer: Self-pay

## 2018-10-22 DIAGNOSIS — Z1211 Encounter for screening for malignant neoplasm of colon: Secondary | ICD-10-CM

## 2018-10-22 DIAGNOSIS — K760 Fatty (change of) liver, not elsewhere classified: Secondary | ICD-10-CM | POA: Diagnosis not present

## 2018-10-22 DIAGNOSIS — K5732 Diverticulitis of large intestine without perforation or abscess without bleeding: Secondary | ICD-10-CM | POA: Diagnosis not present

## 2018-10-22 DIAGNOSIS — R945 Abnormal results of liver function studies: Secondary | ICD-10-CM

## 2018-10-22 DIAGNOSIS — R7989 Other specified abnormal findings of blood chemistry: Secondary | ICD-10-CM | POA: Insufficient documentation

## 2018-10-22 MED ORDER — PEG 3350-KCL-NA BICARB-NACL 420 G PO SOLR: 4000.0000 mL | ORAL | 0 refills | Status: DC

## 2018-10-22 NOTE — Telephone Encounter (Signed)
Pre-op appt 12/20/18 at 11:00am. Appt letter mailed with procedure instructions.

## 2018-10-22 NOTE — Patient Instructions (Signed)
1. We will plan for labs in July, you may go to [facility] to have these done. 2. Colonoscopy to be scheduled.  Please see separate instructions. 3. I would encourage you to continue to cut back on alcohol use given fatty liver seen on ultrasound.  Try to limit to 1 glass of wine per day or less possible.   Fatty Liver Disease  Fatty liver disease occurs when too much fat has built up in your liver cells. Fatty liver disease is also called hepatic steatosis or steatohepatitis. The liver removes harmful substances from your bloodstream and produces fluids that your body needs. It also helps your body use and store energy from the food you eat. In many cases, fatty liver disease does not cause symptoms or problems. It is often diagnosed when tests are being done for other reasons. However, over time, fatty liver can cause inflammation that may lead to more serious liver problems, such as scarring of the liver (cirrhosis) and liver failure. Fatty liver is associated with insulin resistance, increased body fat, high blood pressure (hypertension), and high cholesterol. These are features of metabolic syndrome and increase your risk for stroke, diabetes, and heart disease. What are the causes? This condition may be caused by:  Drinking too much alcohol.  Poor nutrition.  Obesity.  Cushing's syndrome.  Diabetes.  High cholesterol.  Certain drugs.  Poisons.  Some viral infections.  Pregnancy. What increases the risk? You are more likely to develop this condition if you:  Abuse alcohol.  Are overweight.  Have diabetes.  Have hepatitis.  Have a high triglyceride level.  Are pregnant. What are the signs or symptoms? Fatty liver disease often does not cause symptoms. If symptoms do develop, they can include:  Fatigue.  Weakness.  Weight loss.  Confusion.  Abdominal pain.  Nausea and vomiting.  Yellowing of your skin and the white parts of your eyes (jaundice).  Itchy  skin. How is this diagnosed? This condition may be diagnosed by:  A physical exam and medical history.  Blood tests.  Imaging tests, such as an ultrasound, CT scan, or MRI.  A liver biopsy. A small sample of liver tissue is removed using a needle. The sample is then looked at under a microscope. How is this treated? Fatty liver disease is often caused by other health conditions. Treatment for fatty liver may involve medicines and lifestyle changes to manage conditions such as:  Alcoholism.  High cholesterol.  Diabetes.  Being overweight or obese. Follow these instructions at home:   Do not drink alcohol. If you have trouble quitting, ask your health care provider how to safely quit with the help of medicine or a supervised program. This is important to keep your condition from getting worse.  Eat a healthy diet as told by your health care provider. Ask your health care provider about working with a diet and nutrition specialist (dietitian) to develop an eating plan.  Exercise regularly. This can help you lose weight and control your cholesterol and diabetes. Talk to your health care provider about an exercise plan and which activities are best for you.  Take over-the-counter and prescription medicines only as told by your health care provider.  Keep all follow-up visits as told by your health care provider. This is important. Contact a health care provider if: You have trouble controlling your:  Blood sugar. This is especially important if you have diabetes.  Cholesterol.  Drinking of alcohol. Get help right away if:  You have abdominal  pain.  You have jaundice.  You have nausea and vomiting.  You vomit blood or material that looks like coffee grounds.  You have stools that are black, tar-like, or bloody. Summary  Fatty liver disease develops when too much fat builds up in the cells of your liver.  Fatty liver disease often causes no symptoms or problems.  However, over time, fatty liver can cause inflammation that may lead to more serious liver problems, such as scarring of the liver (cirrhosis).  You are more likely to develop this condition if you abuse alcohol, are pregnant, are overweight, have diabetes, have hepatitis, or have high triglyceride levels.  Contact your health care provider if you have trouble controlling your weight, blood sugar, cholesterol, or drinking of alcohol. This information is not intended to replace advice given to you by your health care provider. Make sure you discuss any questions you have with your health care provider. Document Released: 07/28/2005 Document Revised: 03/21/2017 Document Reviewed: 03/21/2017 Elsevier Interactive Patient Education  2019 [employer]

## 2018-10-22 NOTE — Telephone Encounter (Signed)
Called pt, TCS w/Propofol w/RMR scheduled for 12/26/18 at 10:30am. Rx for prep sent to pharmacy. Orders entered.

## 2018-10-22 NOTE — Progress Notes (Signed)
Primary Care Physician:  Diamond Found, MD Primary GI:  Diamond Sessions, MD   Patient Location: Home  Provider Location: [facility] office  Reason for Visit: Diverticulitis, fatty liver likely related to alcohol  Persons present on the virtual encounter, with roles: Patient, myself (provider), Diamond [name], CMA(updated meds and allergies)  Total time (minutes) spent on medical discussion: 17 minutes  Due to COVID-19, visit was conducted using FaceTime method.  Visit was requested by patient.  Virtual Visit via FaceTime  I connected with Diamond [name] on 10/22/18 at  8:00 AM EDT by Facetime and verified that I am speaking with the correct person using two identifiers.   I discussed the limitations, risks, security and privacy concerns of performing an evaluation and management service by telephone/video and the availability of in person appointments. I also discussed with the patient that there may be a patient responsible charge related to this service. The patient expressed understanding and agreed to proceed.   HPI:   Diamond [name] is a 53 y.o. female who presents for virtual visit regarding history of diverticulitis and fatty liver at the request of Dr. Phillips Odor.  Back in February she developed right-sided abdominal pain lasting for 1 to 2 weeks.  She had never experienced anything like this before.  No associated symptoms.  She was diagnosed with diverticulitis and provided antibiotics.  Pain was all right-sided.  Since resolved without any further problems.  See below for abdominal CT and ultrasound results.  Abdominal ultrasound February 2020 for right-sided abdominal pain showed gallbladder wall thickness top normal, could not assess for sonographic [name] sign as patient was tender anytime touch near the right rib cage, liver consistent with fatty infiltration and exam limited due to difficulty penetrating the liver, CT recommended to further evaluate patient's abdominal pain  given limited study.  CT abdomen pelvis with contrast back in February for right-sided abdominal pain.  Findings consistent with fatty liver.  Descending colon and sigmoid diverticulosis with slight stranding noted around the distal descending colon/proximal sigmoid colon compatible with acute diverticulitis.  Patient has never had a colonoscopy.  No family history of colon cancer or colon polyps.  Bowel movements are regular.  No blood in the stool or melena.  Denies any upper GI symptoms.  No unintentional weight loss.  She says she has had elevated liver function test in the past, she believes the last time they were checked was in 2018 by [name].  At that point her AST was in the mid 40 range with other parameters normal.  She is never been checked for hepatitis B or C.  Over the past couple of years she drinks a couple glasses of wine a night.  She denies anything more excessive than that.  She had a DUI at age 65.  Current Outpatient Medications  Medication Sig Dispense Refill  . escitalopram (LEXAPRO) 10 MG tablet TAKE ONE TABLET (  TOTAL) BY MOUTH DAILY 30 tablet 6  . hydrochlorothiazide (HYDRODIURIL) 25 MG tablet TAKE ONE TABLET (  TOTAL) BY MOUTH DAILY 30 tablet 6   No current facility-administered medications for this visit.     Past Medical History:  Diagnosis Date  . Anxiety 12/07/2014  . Mental disorder   . 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 No pertinent surgical history.  No prior surgery.  Family History  Problem Relation Age of Onset  . Diabetes Paternal Grandmother   . Stroke Paternal Grandfather   . Colon  cancer Neg Hx   . Colon polyps Neg Hx     Social History   Socioeconomic History  . Marital status: Married    Spouse name: Not on file  . Number of children: Not on file  . Years of education: Not on file  . Highest education level: Not on file  Occupational History  . Occupation: Magazine features editor: [employer]  Social Needs  . Financial resource strain: Not on file  . Food insecurity:    Worry: Not on file    Inability: Not on file  . Transportation needs:    Medical: Not on file    Non-medical: Not on file  Tobacco Use  . Smoking status: Former Smoker    Types: E-cigarettes    Last attempt to quit: 06/11/2017    Years since quitting: 1.3  . Smokeless tobacco: Never Used  Substance and Sexual Activity  . Alcohol use: Yes    Comment: Patient reports 2 glasses of wine per night for the past couple of years.  . Drug use: No  . Sexual activity: Not Currently    Birth control/protection: None  Lifestyle  . Physical activity:    Days per week: Not on file    Minutes per session: Not on file  . Stress: Not on file  Relationships  . Social connections:    Talks on phone: Not on file    Gets together: Not on file    Attends religious service: Not on file    Active member of club or organization: Not on file    Attends meetings of clubs or organizations: Not on file    Relationship status: Not on file  . Intimate partner violence:    Fear of current or ex partner: Not on file    Emotionally abused: Not on file    Physically abused: Not on file    Forced sexual activity: Not on file  Other Topics Concern  . Not on file  Social History Narrative  . Not on file      ROS:  General: Negative for anorexia, weight loss, fever, chills, fatigue, weakness. Eyes: Negative for vision changes.  ENT: Negative for hoarseness, difficulty swallowing , nasal congestion. CV: Negative for chest pain, angina, palpitations, dyspnea on exertion, peripheral edema.  Respiratory: Negative for dyspnea at rest, dyspnea on exertion, cough, sputum, wheezing.  GI: See history of present illness. GU:  Negative for dysuria, hematuria, urinary incontinence, urinary frequency, nocturnal urination.  MS: Negative for joint pain, low back pain.  Derm: Negative for rash or itching.  Neuro: Negative for  weakness, abnormal sensation, seizure, frequent headaches, memory loss, confusion.  Psych: Negative for anxiety, depression, suicidal ideation, hallucinations.  Endo: Negative for unusual weight change.  Heme: Negative for bruising or bleeding. Allergy: Negative for rash or hives.   Observations/Objective: Pleasant well-nourished well-developed female in no acute distress.  Otherwise exam unavailable.  Assessment and Plan: Pleasant 53 year old female presenting for further evaluation of fatty liver, diverticulitis, need for colonoscopy.  First episode of diverticulitis back in February, symptoms have completely resolved after antibiotic therapy.  Interestingly she tells me her pain was right-sided, unclear that the pain could be explained by sigmoid diverticulitis.  She is also noted to have fatty liver and she reports in the past she has had elevated LFTs with no recent labs however.  She consumes 2 glasses of wine daily denies excessive amounts although there is some indication by PCP that she  may drink more heavily and she reports today.  We discussed fatty liver, natural history and potential for ultrasound findings to be explained by alcohol use, Elita Booneash, other etiologies such as viral hepatitis etc.  Recommend further evaluation with labs which we will plan in July hopefully after COVID-19 crisis improves.  Encouraged her to cut back on alcohol consumption at least no more than 1 glass of wine per night with potential for stopping.  She is overdue for a colonoscopy predominantly for screening purposes.  We will get her on the schedule for July.  Plan for deep sedation given daily alcohol use.  I have discussed the risks, alternatives, benefits with regards to but not limited to the risk of reaction to medication, bleeding, infection, perforation and the patient is agreeable to proceed. Written consent to be obtained.   Follow Up Instructions:    I discussed the assessment and treatment plan  with the patient. The patient was provided an opportunity to ask questions and all were answered. The patient agreed with the plan and demonstrated an understanding of the instructions. AVS mailed to patient's home address.   The patient was advised to call back or seek an in-person evaluation if the symptoms worsen or if the condition fails to improve as anticipated.  I provided 17 minutes of virtual face-to-face time during this encounter.   Tana CoastLeslie Shakerria Parran, PA-C

## 2018-10-23 NOTE — Progress Notes (Signed)
CC'ED TO PCP 

## 2018-10-30 ENCOUNTER — Telehealth: Payer: Self-pay | Admitting: Gastroenterology

## 2018-10-30 NOTE — Telephone Encounter (Signed)
Please let patient know that I reviewed labs from February 2020 from her PCP.  Her kidney function was normal.  Her LFTs were abnormal.  Her total bilirubin 1.4, alkaline phosphatase 86, AST 170, ALT 99, total cholesterol 272 with HDL of 207?  We already had plans for labs to evaluate these abnormalities.  He had a CT back in February that showed some fatty liver.  At this point I would encourage her to eliminate all alcohol. She can have her labs done at [facility] whenever she feels comfortable going out in the setting of COVID-19 pandemic.

## 2018-10-30 NOTE — Telephone Encounter (Signed)
Noted. Spoke to pt and she is aware that she should have labs done.

## 2018-12-16 ENCOUNTER — Encounter (HOSPITAL_COMMUNITY): Payer: Self-pay

## 2018-12-20 ENCOUNTER — Other Ambulatory Visit: Payer: Self-pay

## 2018-12-20 ENCOUNTER — Encounter (HOSPITAL_COMMUNITY)
Admission: RE | Admit: 2018-12-20 | Discharge: 2018-12-20 | Disposition: A | Discharge status: Home / Self Care | Payer: BC Managed Care – PPO | Source: Ambulatory Visit | Attending: Internal Medicine | Admitting: Internal Medicine

## 2018-12-20 HISTORY — DX: Essential (primary) hypertension: I10

## 2018-12-23 ENCOUNTER — Other Ambulatory Visit (HOSPITAL_COMMUNITY)
Admission: RE | Admit: 2018-12-23 | Discharge: 2018-12-23 | Disposition: A | Discharge status: Home / Self Care | Payer: BC Managed Care – PPO | Source: Ambulatory Visit | Attending: Internal Medicine | Admitting: Internal Medicine

## 2018-12-23 ENCOUNTER — Telehealth: Payer: Self-pay | Admitting: *Deleted

## 2018-12-23 NOTE — Telephone Encounter (Signed)
-----   Message from Encarnacion Chu, RN sent at 12/23/2018  8:04 AM EDT ----- No. She never showed.

## 2018-12-23 NOTE — Telephone Encounter (Signed)
Called patient and she has been provided with pre-op phone #.

## 2018-12-24 ENCOUNTER — Telehealth: Payer: Self-pay

## 2018-12-24 NOTE — Telephone Encounter (Signed)
Cleo at [facility] endo called office, pt was no show for COVID test yesterday for procedure 12/26/18. TCS w/Propofol w/RMR will have to be canceled.   Called pt, informed her TCS has been canceled d/t no show for COVID test. Offered to reschedule procedure. States she will call office back to reschedule. Informed endo scheduler. FYI to LSL.

## 2018-12-26 ENCOUNTER — Encounter (HOSPITAL_COMMUNITY): Admission: RE | Payer: Self-pay | Source: Home / Self Care

## 2018-12-26 ENCOUNTER — Ambulatory Visit (HOSPITAL_COMMUNITY)
Admission: RE | Admit: 2018-12-26 | Payer: BC Managed Care – PPO | Source: Home / Self Care | Admitting: Internal Medicine

## 2018-12-26 SURGERY — COLONOSCOPY WITH PROPOFOL
Anesthesia: Monitor Anesthesia Care

## 2019-03-25 ENCOUNTER — Other Ambulatory Visit: Payer: Self-pay

## 2019-03-25 DIAGNOSIS — Z20822 Contact with and (suspected) exposure to covid-19: Secondary | ICD-10-CM

## 2019-03-26 ENCOUNTER — Other Ambulatory Visit: Payer: Self-pay | Admitting: Adult Health

## 2019-03-26 LAB — NOVEL CORONAVIRUS, NAA: SARS-CoV-2, NAA: NOT DETECTED

## 2019-03-31 ENCOUNTER — Telehealth: Payer: Self-pay | Admitting: General Practice

## 2019-03-31 NOTE — Telephone Encounter (Signed)
Pt called in for covid result.  °Advised of Not Detected result.  °

## 2019-04-25 ENCOUNTER — Other Ambulatory Visit: Payer: Self-pay

## 2019-04-25 DIAGNOSIS — Z20822 Contact with and (suspected) exposure to covid-19: Secondary | ICD-10-CM

## 2019-04-26 LAB — NOVEL CORONAVIRUS, NAA: SARS-CoV-2, NAA: NOT DETECTED

## 2019-04-30 ENCOUNTER — Telehealth: Payer: Self-pay | Admitting: Family Medicine

## 2019-04-30 NOTE — Telephone Encounter (Signed)
Patient called in and received her covid test result  °

## 2019-06-12 ENCOUNTER — Other Ambulatory Visit: Payer: Self-pay

## 2019-06-12 ENCOUNTER — Ambulatory Visit: Payer: BC Managed Care – PPO | Attending: Internal Medicine

## 2019-06-12 DIAGNOSIS — Z20822 Contact with and (suspected) exposure to covid-19: Secondary | ICD-10-CM

## 2019-06-14 LAB — NOVEL CORONAVIRUS, NAA: SARS-CoV-2, NAA: NOT DETECTED

## 2019-08-31 ENCOUNTER — Ambulatory Visit: Payer: BC Managed Care – PPO

## 2019-11-03 ENCOUNTER — Other Ambulatory Visit: Payer: Self-pay | Admitting: Adult Health

## 2019-12-04 ENCOUNTER — Encounter: Payer: Self-pay | Admitting: Adult Health

## 2019-12-04 ENCOUNTER — Ambulatory Visit: Payer: BC Managed Care – PPO | Admitting: Adult Health

## 2019-12-04 VITALS — BP 99/63 | HR 80 | Ht 67.5 in | Wt 171.0 lb

## 2019-12-04 DIAGNOSIS — I10 Essential (primary) hypertension: Secondary | ICD-10-CM

## 2019-12-04 DIAGNOSIS — F419 Anxiety disorder, unspecified: Secondary | ICD-10-CM | POA: Diagnosis not present

## 2019-12-04 MED ORDER — ESCITALOPRAM OXALATE 10 MG PO TABS: ORAL_TABLET | ORAL | 6 refills | Status: DC

## 2019-12-04 MED ORDER — HYDROCHLOROTHIAZIDE 25 MG PO TABS: ORAL_TABLET | ORAL | 6 refills | Status: DC

## 2019-12-04 NOTE — Progress Notes (Signed)
°  Subjective:     Patient ID: Diamond [name], female   DOB: Apr 12, 1966, 54 y.o.   MRN: [id number]  HPI Diamond [name] is a 54 year old white female,maried, PM in for refills on lexapro and hydrodiuril, she is doing good, no complaints. PCP is Dr Phillips Odor  Review of Systems Patient denies any headaches, hearing loss, fatigue, blurred vision, shortness of breath, chest pain, abdominal pain, problems with bowel movements, urination, or intercourse(not active). No joint pain or mood swings.   Reviewed past medical,surgical, social and family history. Reviewed medications and allergies.  Objective:   Physical Exam BP 99/63 (BP Location: Left Arm, Patient Position: Sitting, Cuff Size: Normal)    Pulse 80    Ht 5' 7.5" (1.715 m)    Wt 171 lb (77.6 kg)    LMP 11/08/2014    BMI 26.39 kg/m Fall risk is low Skin warm and dry. Neck: mid line trachea, normal thyroid, good ROM, no lymphadenopathy noted. Lungs: clear to ausculation bilaterally. Cardiovascular: regular rate and rhythm.    Assessment:     1. Anxiety Continue lexapro Meds ordered this encounter  Medications   escitalopram (LEXAPRO) 10 MG tablet    Sig: TAKE ONE TABLET (10MG  TOTAL) BY MOUTH DAILY    Dispense:  30 tablet    Refill:  6    Order Specific Question:   Supervising Provider    Answer:   H [[id number]]   hydrochlorothiazide (HYDRODIURIL) 25 MG tablet    Sig: TAKE ONE TABLET (25MG  TOTAL) BY MOUTH DAILY    Dispense:  30 tablet    Refill:  6    Order Specific Question:   Supervising Provider    Answer:   Duane Lope, LUTHER H [[id number]]    2. Essential hypertension continue hydrodiuril    Plan:     Get mammogram ASAP Return in 6 weeks for pap and physical and fasting labs

## 2020-03-12 ENCOUNTER — Encounter: Payer: Self-pay | Admitting: Adult Health

## 2020-03-12 ENCOUNTER — Ambulatory Visit: Payer: BC Managed Care – PPO | Admitting: Adult Health

## 2020-03-12 VITALS — BP 143/91 | HR 85 | Ht 67.5 in | Wt 171.0 lb

## 2020-03-12 DIAGNOSIS — F419 Anxiety disorder, unspecified: Secondary | ICD-10-CM

## 2020-03-12 DIAGNOSIS — F4321 Adjustment disorder with depressed mood: Secondary | ICD-10-CM | POA: Diagnosis not present

## 2020-03-12 MED ORDER — PAROXETINE HCL 10 MG PO TABS: 10.0000 mg | ORAL_TABLET | Freq: Every day | ORAL | 3 refills | Status: DC

## 2020-03-12 NOTE — Progress Notes (Signed)
  Subjective:     Patient ID: Diamond [name], female   DOB: 03/09/1966, 54 y.o.   MRN: [id number]  HPI Diamond [name] is a 54 year old white female, recently widowed, PM in to alk about changing lexapro to something else, has been on for years.She is seeing Diamond [name] a counselor at [facility] counseling in [place].She is trying to decrease her drinking, too.And Diamond [name] suggested change meds, Her husband had cirrhosis of the liver. PCP is Dr Hilma Favors.  Review of Systems +anxiety +grief reaction Reviewed past medical,surgical, social and family history. Reviewed medications and allergies.     Objective:   Physical Exam BP (!) 143/91 (BP Location: Right Arm, Patient Position: Sitting, Cuff Size: Normal)   Pulse 85   Ht 5' 7.5" (1.715 m)   Wt 171 lb (77.6 kg)   LMP 11/08/2014   BMI 26.39 kg/m Skin warm and dry. . Lungs: clear to ausculation bilaterally. Cardiovascular: regular rate and rhythm. Fall risk is low PHQ 9 score is 6, no SI  Upstream - 03/12/20 0954      Pregnancy Intention Screening   Does the patient want to become pregnant in the next year? N/A   postmenopausal   Does the patient's partner want to become pregnant in the next year? N/A      Contraception Wrap Up   Contraception Counseling Provided No             Assessment:     1. Anxiety Stop lexapro Will start paxil in am Meds ordered this encounter  Medications  . PARoxetine (PAXIL) 10 MG tablet    Sig: Take 1 tablet (10 mg total) by mouth daily.    Dispense:  30 tablet    Refill:  3    Order Specific Question:   Supervising Provider    Answer:   Elonda Husky, LUTHER H [[id number]]    2. Grief reaction Try journaling Try to walk      Plan:     Follow up with me in 6 weeks  See Diamond [name] 10/4

## 2020-04-23 ENCOUNTER — Ambulatory Visit: Payer: BC Managed Care – PPO | Admitting: Adult Health

## 2020-06-08 ENCOUNTER — Other Ambulatory Visit (HOSPITAL_COMMUNITY): Payer: Self-pay | Admitting: Physician Assistant

## 2020-06-08 DIAGNOSIS — Z1231 Encounter for screening mammogram for malignant neoplasm of breast: Secondary | ICD-10-CM

## 2020-06-14 ENCOUNTER — Encounter: Payer: Self-pay | Admitting: *Deleted

## 2020-07-09 ENCOUNTER — Other Ambulatory Visit: Payer: Self-pay | Admitting: Adult Health

## 2020-07-19 ENCOUNTER — Ambulatory Visit: Payer: Self-pay

## 2020-07-19 ENCOUNTER — Other Ambulatory Visit: Payer: Self-pay

## 2020-07-21 ENCOUNTER — Telehealth: Payer: Self-pay

## 2020-07-21 NOTE — Telephone Encounter (Signed)
Patient calling c/o bad appetite and hot flashes. Stated nurse at work did a one time bp check and it was 160/90. Would like a call back from [place].

## 2020-07-21 NOTE — Telephone Encounter (Signed)
Left message I called and COVID could affect appetite not sure what BP doing. Call me tomorrow.

## 2020-07-21 NOTE — Telephone Encounter (Signed)
Pt has a bad appetite, hot flashes and trouble sleeping. Her BP was 160/98. No headache or blurred vision. Pt states the school nurse will check again this afternoon. Wanted you to know. I advised pt to send a MyChart message this afternoon with BP reading. Pt voiced understanding. JSY

## 2020-07-23 ENCOUNTER — Telehealth: Payer: Self-pay | Admitting: Adult Health

## 2020-07-23 NOTE — Telephone Encounter (Signed)
BP was 140/92, continue BP meds at current dose

## 2020-07-23 NOTE — Telephone Encounter (Signed)
Patient called stating that she would like to speak with Victorino Dike. Patient did not state the reason for the call.

## 2020-08-18 IMAGING — US US ABDOMEN COMPLETE
1 series · 13 of 25 positions shown · non-contrast
Comparison: None.

Addendum:
CLINICAL DATA: 52-year-old female with right upper quadrant pain
for the past 2 days. Initial encounter.

EXAM:
ABDOMEN ULTRASOUND COMPLETE

[Series 1: us abdomen complete · 13 of 126 slices shown]
[im 1/126]
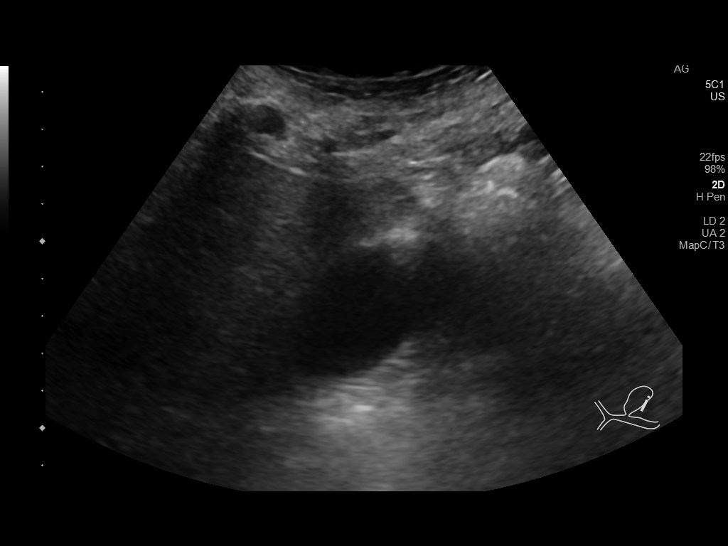
[im 11/126]
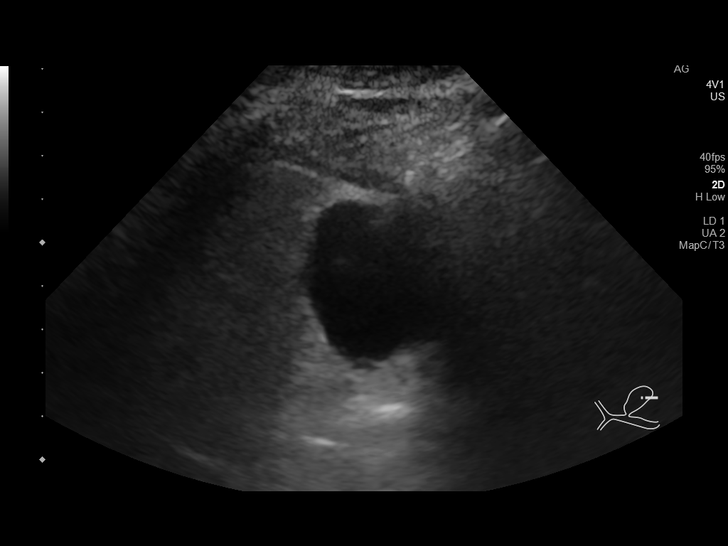
[im 21/126]
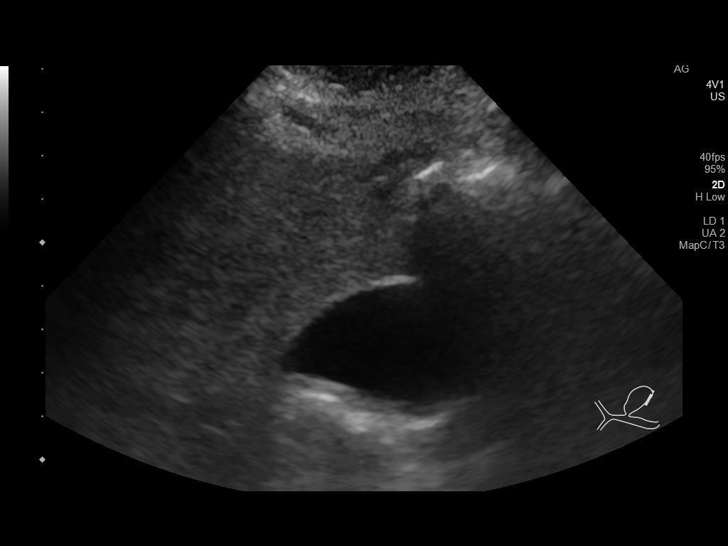
[im 32/126]
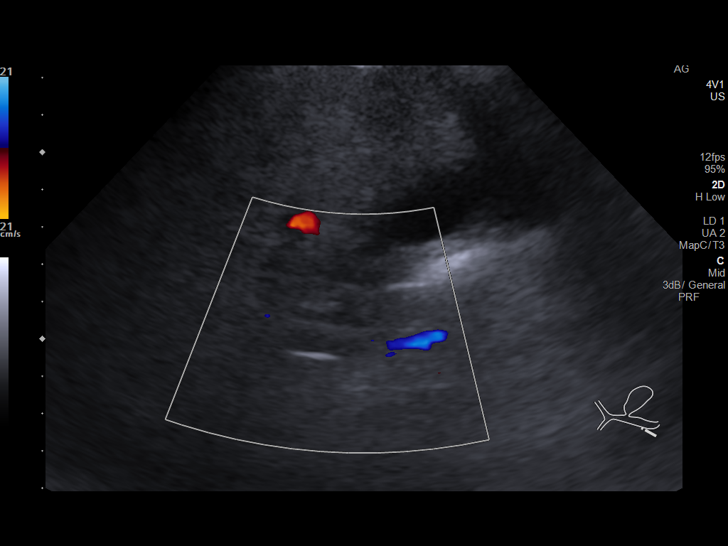
[im 42/126]
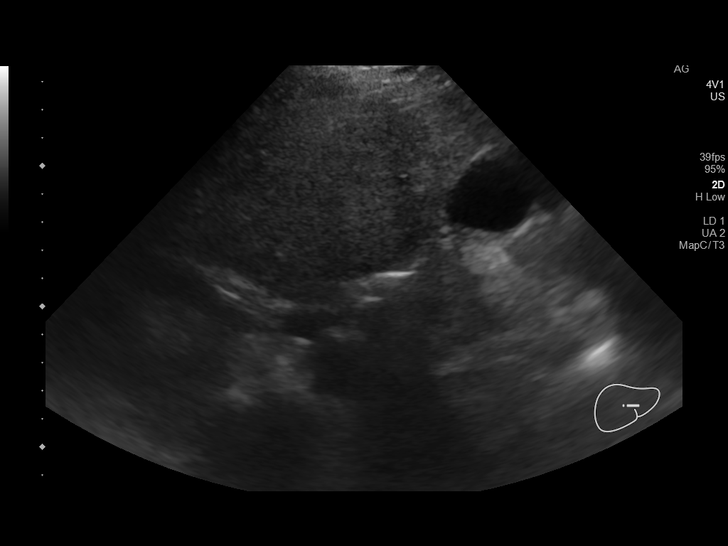
[im 53/126]
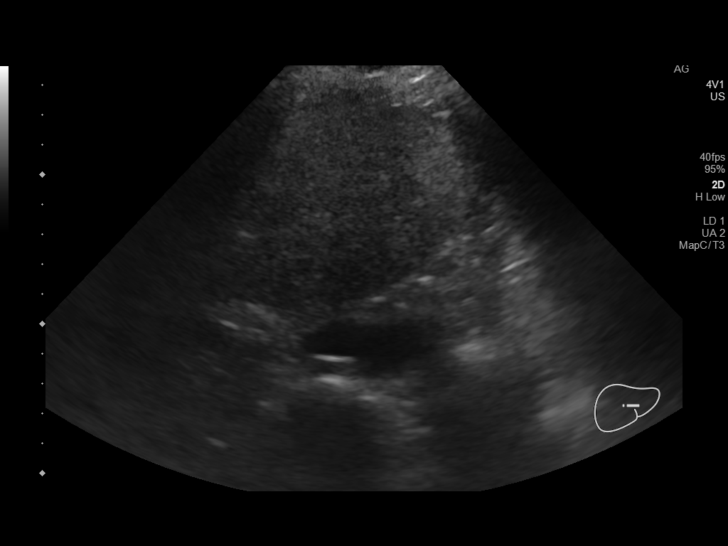
[im 63/126]
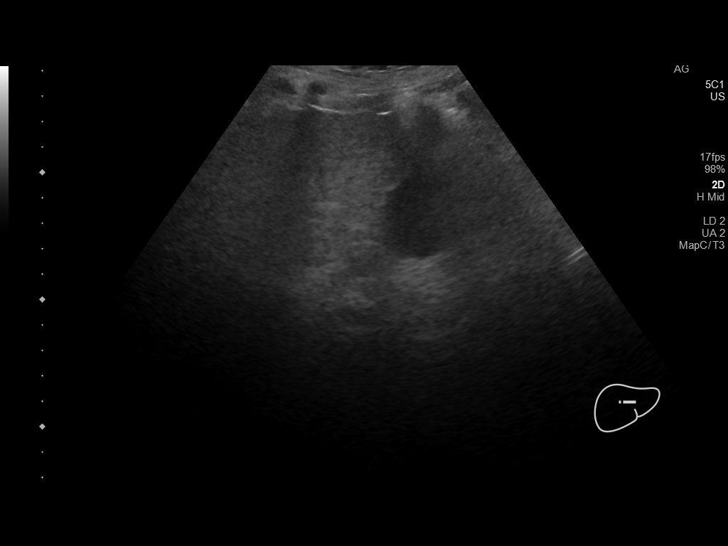
[im 73/126]
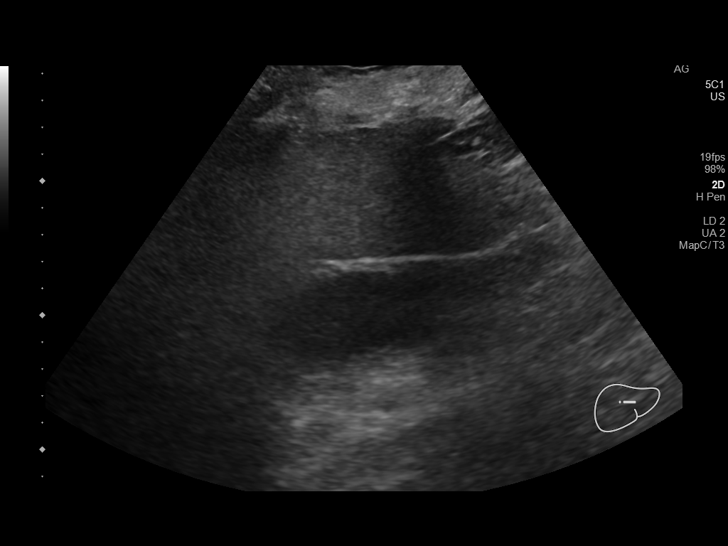
[im 84/126]
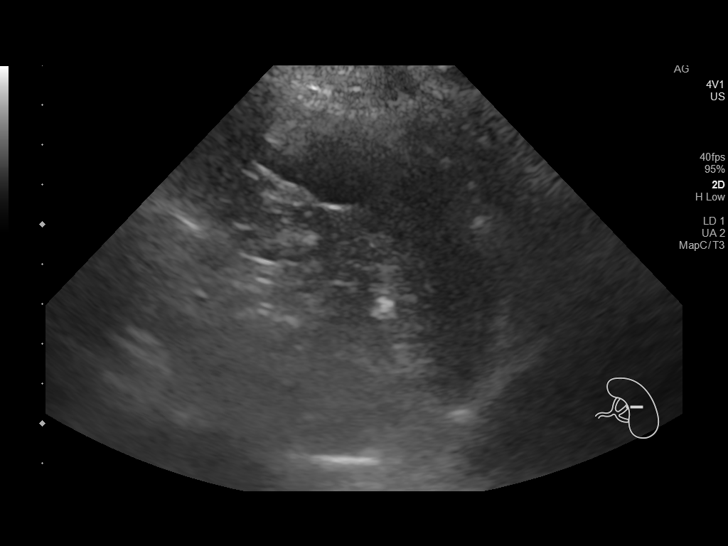
[im 94/126]
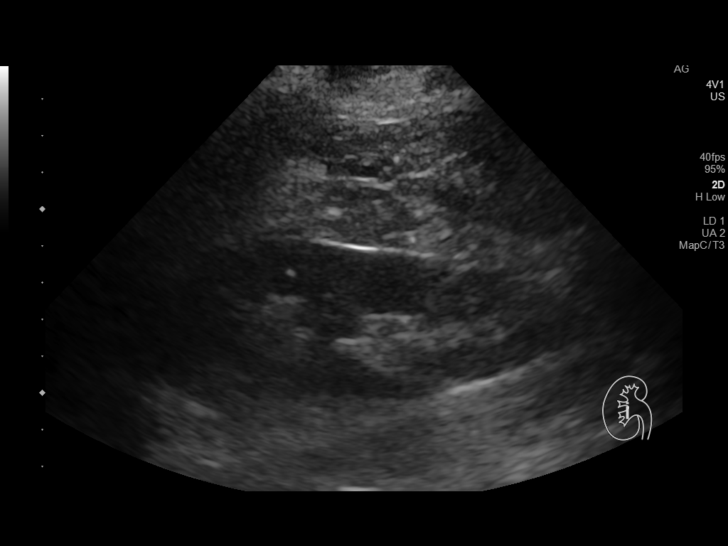
[im 105/126]
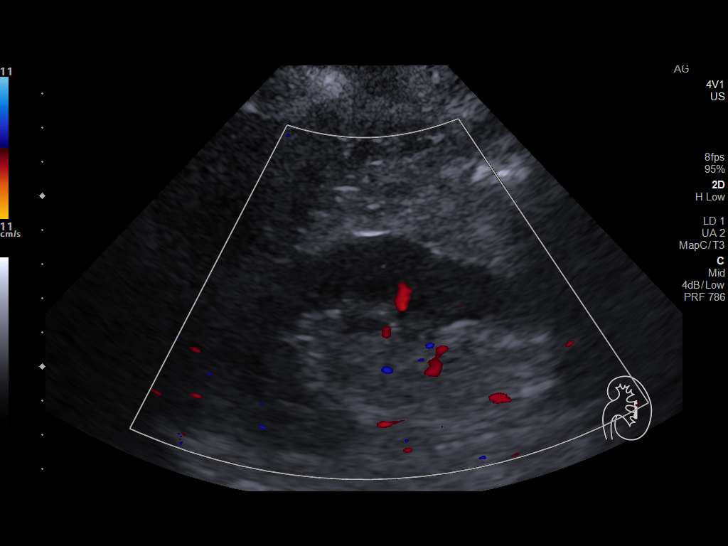
[im 115/126]
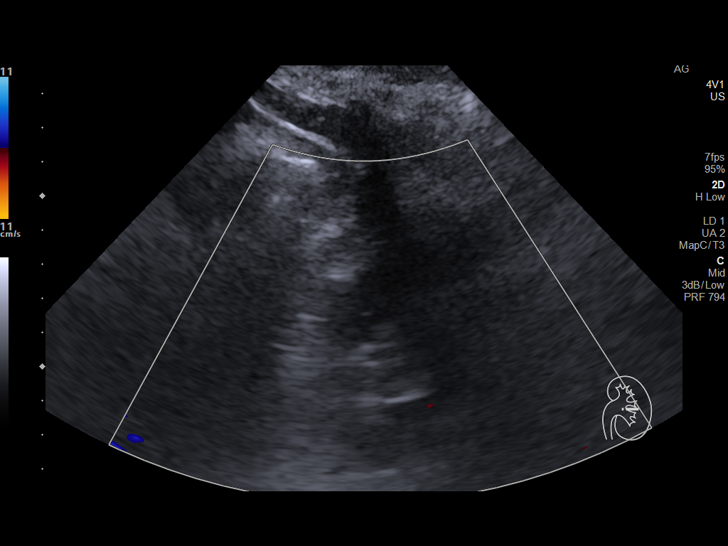
[im 126/126]
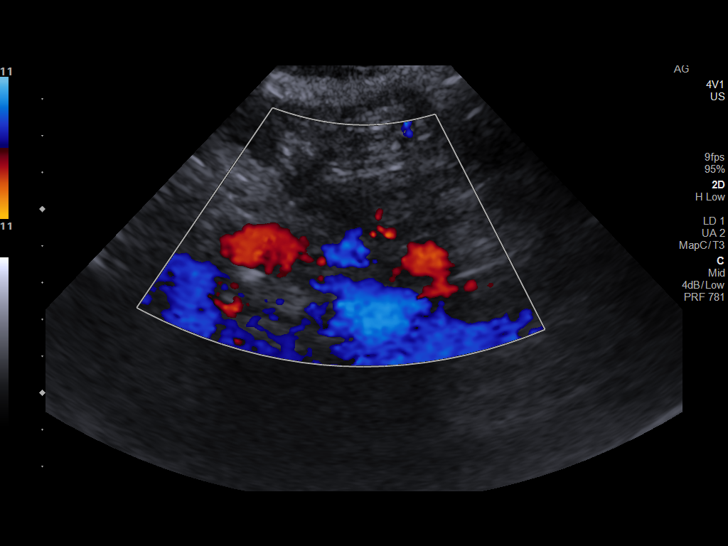

[13 of 25 positions shown; findings below may reference images not displayed]

FINDINGS: Gallbladder: No gallstones detected. Gallbladder wall thickness
top-normal. Not able to assess for sonographic Murphy sign as
patient was tender any time touched near the right rib cage.

Common bile duct: Diameter: 3.3 mm

Liver: Liver of increased echogenicity consistent with fatty
infiltration and/or hepatocellular disease. Difficult to penetrate.
No obvious mass although evaluation limited. Evaluation portal vein
limited.

IVC: Evaluation limited secondary to bowel gas.

Pancreas: Evaluation limited secondary to bowel gas.

Spleen: Size and appearance within normal limits.

Right Kidney: Length: 11.3 cm. Echogenicity within normal limits. No
mass or hydronephrosis visualized.

Left Kidney: Length: 11.3 cm. Echogenicity within normal limits. No
mass or hydronephrosis visualized.

Abdominal aorta: Evaluation limited by bowel gas. Atherosclerotic
changes noted. Proximal abdominal aorta measures up to 2.9 cm.

Other findings: None.
IMPRESSION: 1. No gallstones detected. Gallbladder wall thickness top-normal.
Not able to assess for sonographic Murphy sign as patient was tender
any time touched near the right rib cage.
2. Liver of increased echogenicity consistent with fatty
infiltration and/or hepatocellular disease. Difficult to penetrate
the liver.
3. Secondary to patient motion and bowel gas, not able to assess the
inferior vena cava, pancreas or full extent of the abdominal aorta.
The portion of the abdominal aorta which is visualized reveals
atherosclerotic changes with proximal aorta measuring 2.9 cm.
4. Patient would be better suited for CT imaging which may be
considered given the diffuse abdominal pain.

These results will be called to the ordering clinician or
representative by the Radiologist Assistant, and communication
documented in the PACS or zVision Dashboard.

ADDENDUM:
These results were called by telephone at the time of interpretation
on 08/06/2018 at [DATE] to NORI LIPE , who verbally
acknowledged these results. CT is going to be ordered.

*** End of Addendum ***

## 2020-08-18 IMAGING — CT CT ABD-PELV W/ CM
2 of 5 series · 15 of 46 positions shown, 17 images · IV contrast (Isovue)
Comparison: CT 08/06/2018

CLINICAL DATA: Right side abdominal pain.

EXAM:
CT ABDOMEN AND PELVIS WITH CONTRAST
TECHNIQUE: Multidetector CT imaging of the abdomen and pelvis was performed
using the standard protocol following bolus administration of
intravenous contrast.
CONTRAST:  100mL OMNIPAQUE IOHEXOL 300 MG/ML SOLN, 30mL HRXH7Q-CEE
IOPAMIDOL (HRXH7Q-CEE) INJECTION 61%

[Series 2: axial st · axial · 0.67mm/px · z∈[-482,-57]mm · 12 of 96 slices shown, 14 images]
[im 6/96  soft-tissue]
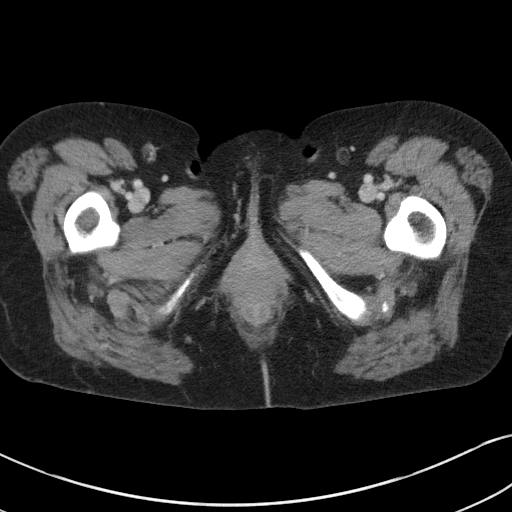
[im 6/96  bone]
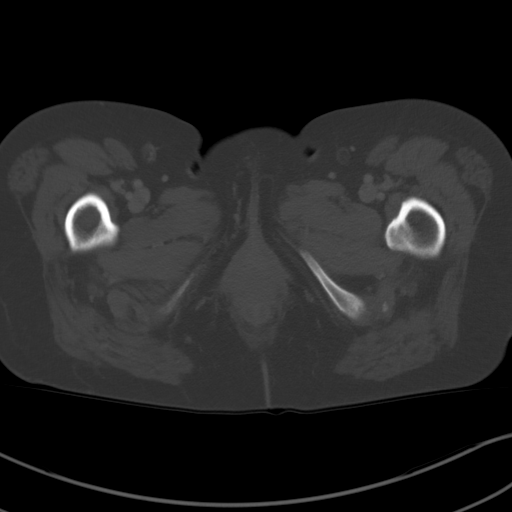
[im 16/96  soft-tissue]
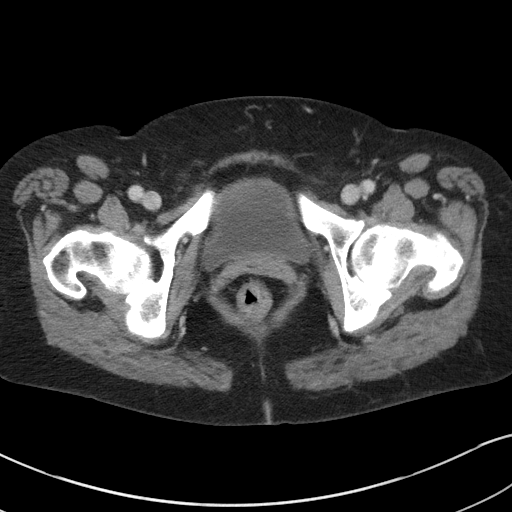
[im 21/96  soft-tissue]
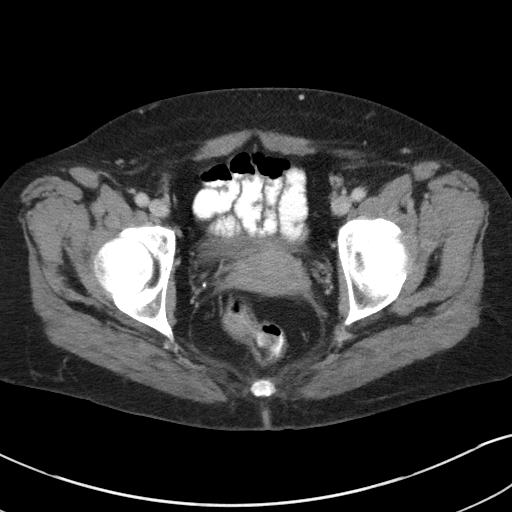
[im 31/96  soft-tissue]
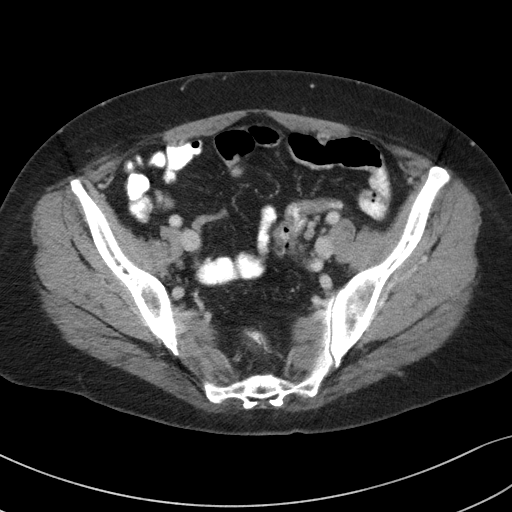
[im 36/96  soft-tissue]
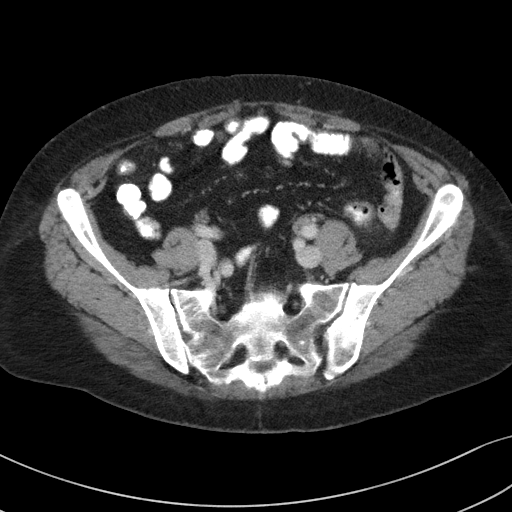
[im 46/96  soft-tissue]
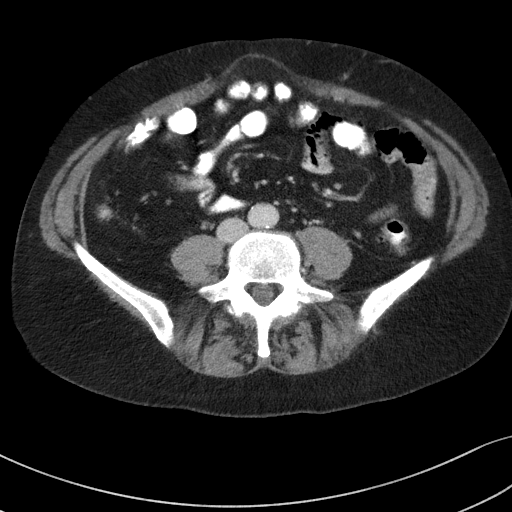
[im 51/96  soft-tissue]
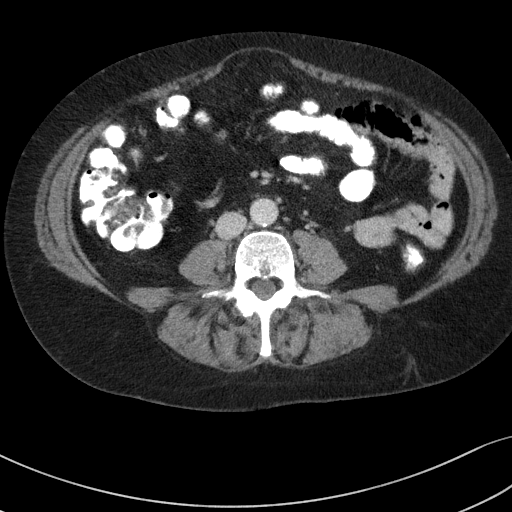
[im 61/96  soft-tissue]
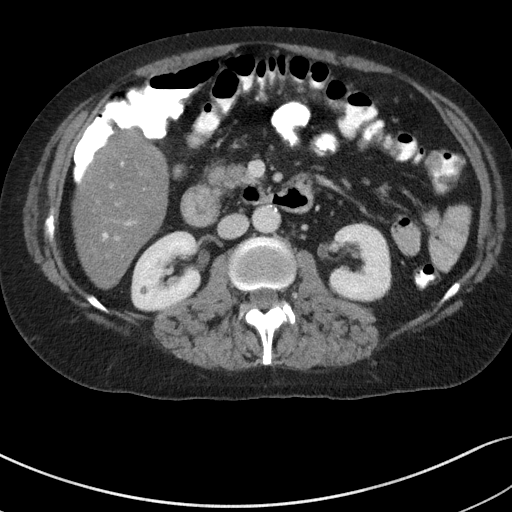
[im 66/96  soft-tissue]
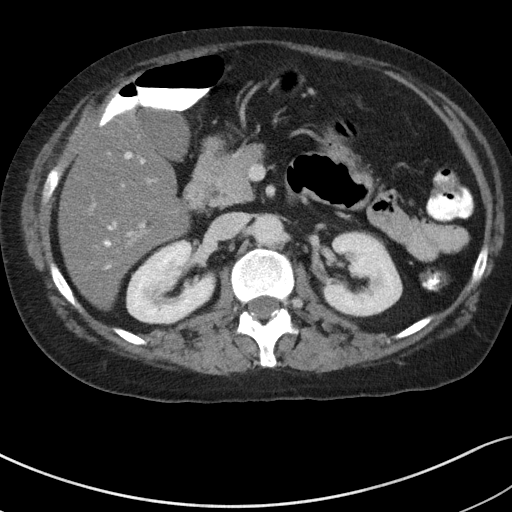
[im 66/96  bone]
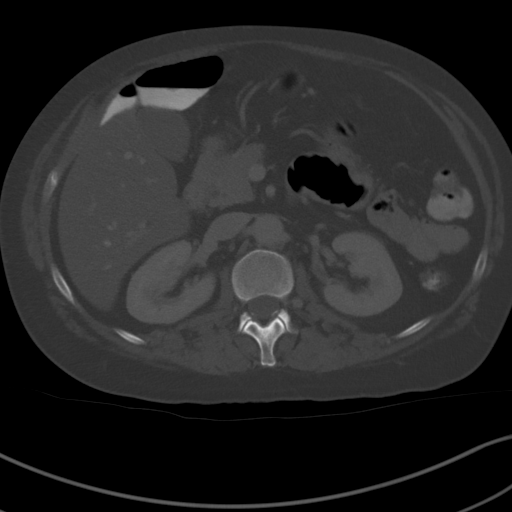
[im 76/96  soft-tissue]
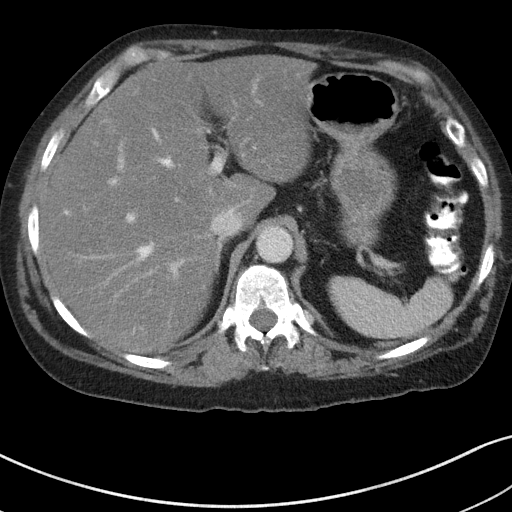
[im 81/96  soft-tissue]
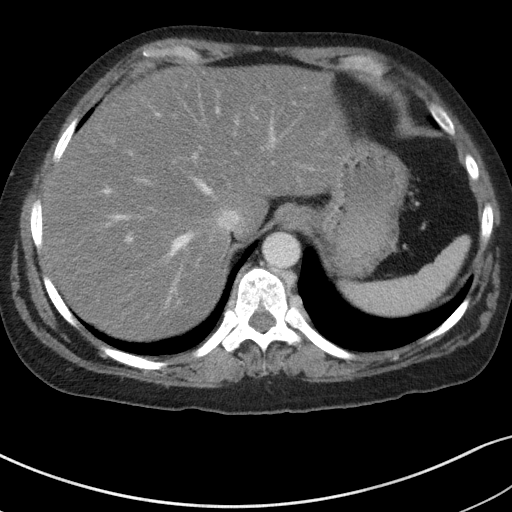
[im 91/96  soft-tissue]
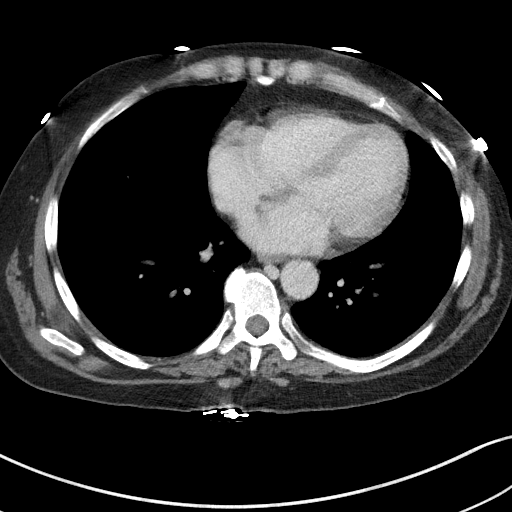

[Series 5: coronal st · coronal · 0.60mm/px · 3 of 87 slices shown]
[im 29/87  soft-tissue]
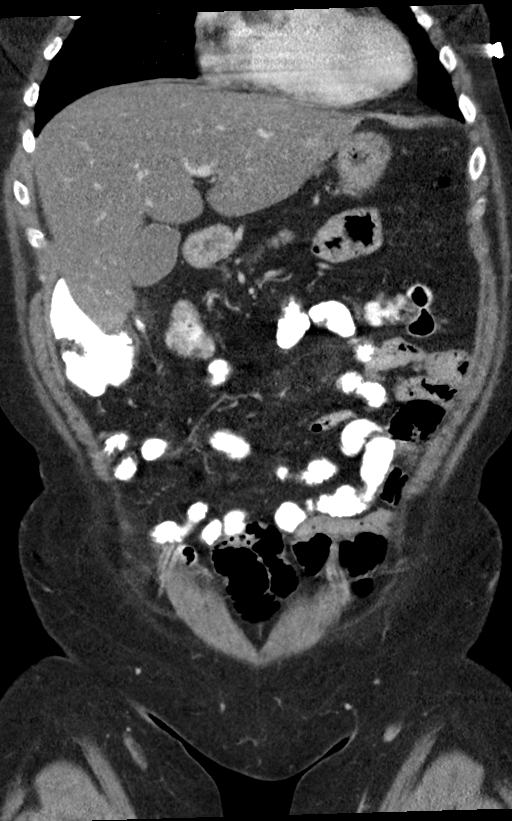
[im 39/87  soft-tissue]
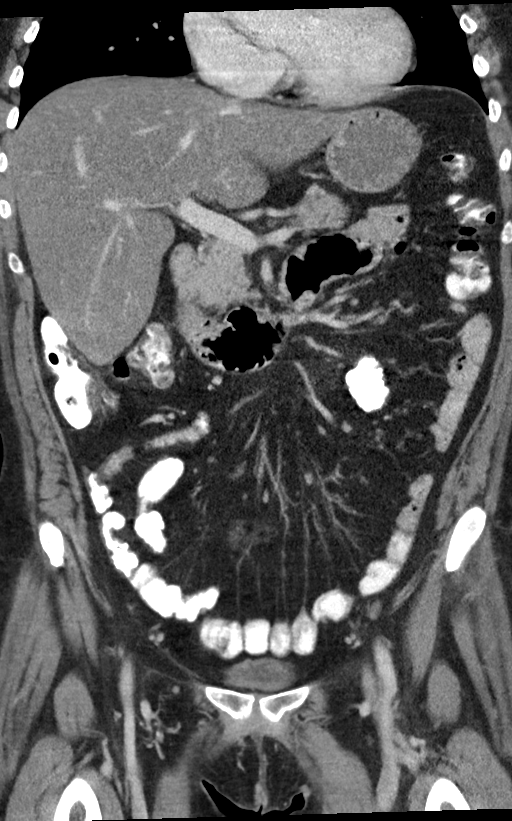
[im 48/87  soft-tissue]
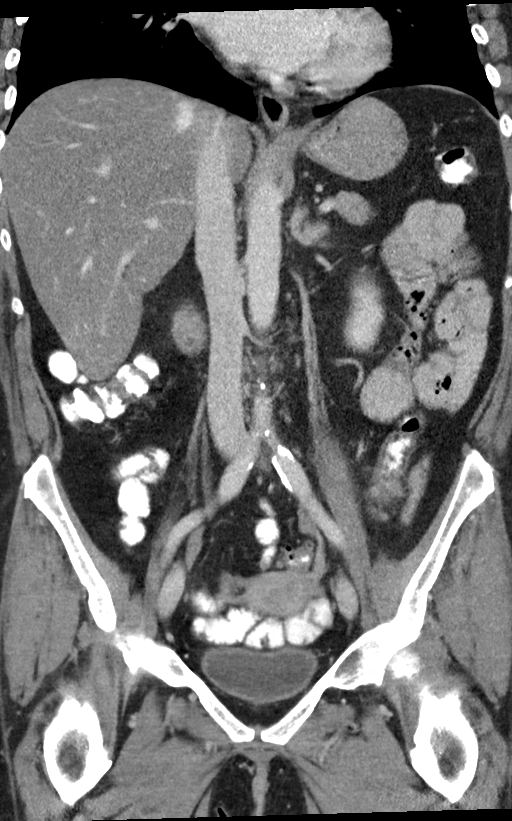

[15 of 46 positions shown; findings below may reference images not displayed]

FINDINGS: Lower chest: Lung bases are clear. No effusions. Heart is normal
size.

Hepatobiliary: Diffuse low-density throughout the liver compatible
with fatty infiltration. No focal abnormality. Gallbladder
unremarkable.

Pancreas: No focal abnormality or ductal dilatation.

Spleen: No focal abnormality.  Normal size.

Adrenals/Urinary Tract: No adrenal abnormality. No focal renal
abnormality. No stones or hydronephrosis. Urinary bladder is
unremarkable.

Stomach/Bowel: Descending colonic and sigmoid diverticulosis. Slight
stranding noted around the distal descending colon/proximal sigmoid
colon compatible with active diverticulitis. Appendix is visualized
and is normal. Stomach and small bowel decompressed, unremarkable.

Vascular/Lymphatic: Scattered aortic atherosclerosis. No aneurysm or
adenopathy. Retroaortic left renal vein noted.

Reproductive: Uterus and adnexa unremarkable.  No mass.

Other: No free fluid or free air.

Musculoskeletal: No acute bony abnormality.
IMPRESSION: Descending colonic and sigmoid diverticulosis. Slight
haziness/stranding around the distal descending colon and proximal
sigmoid colon concerning for early active diverticulitis.

Normal appendix.

Diffuse fatty infiltration of the liver.

Aortic atherosclerosis.

## 2020-11-11 ENCOUNTER — Other Ambulatory Visit: Payer: Self-pay | Admitting: Adult Health

## 2021-03-09 ENCOUNTER — Other Ambulatory Visit: Payer: Self-pay | Admitting: Adult Health

## 2021-07-07 ENCOUNTER — Other Ambulatory Visit: Payer: Self-pay | Admitting: Adult Health

## 2021-09-05 ENCOUNTER — Telehealth: Payer: Self-pay | Admitting: Adult Health

## 2021-09-05 MED ORDER — HYDROXYZINE PAMOATE 25 MG PO CAPS: 25.0000 mg | ORAL_CAPSULE | Freq: Three times a day (TID) | ORAL | 1 refills | Status: DC | PRN

## 2021-09-05 NOTE — Addendum Note (Signed)
Addended by: Derrek Monaco A on: 09/05/2021 05:27 PM ? ? Modules accepted: Orders ? ?

## 2021-09-05 NOTE — Telephone Encounter (Signed)
Diamond [name] is getting ready to go to rehab at [facility], 3/35/23 or 26, but feeling shaky, will rx vistaril to see if helps.call me back in about 24-48 hours and let me know.  ?

## 2021-09-05 NOTE — Telephone Encounter (Signed)
Patient wants you to call her 

## 2021-09-24 ENCOUNTER — Ambulatory Visit
Admission: EM | Admit: 2021-09-24 | Discharge: 2021-09-24 | Disposition: A | Discharge status: Home / Self Care | Payer: BC Managed Care – PPO

## 2021-09-24 ENCOUNTER — Ambulatory Visit (INDEPENDENT_AMBULATORY_CARE_PROVIDER_SITE_OTHER): Payer: BC Managed Care – PPO

## 2021-09-24 DIAGNOSIS — M25561 Pain in right knee: Secondary | ICD-10-CM

## 2021-09-24 DIAGNOSIS — I10 Essential (primary) hypertension: Secondary | ICD-10-CM | POA: Diagnosis not present

## 2021-09-24 MED ORDER — PREDNISONE 20 MG PO TABS: ORAL_TABLET | ORAL | 0 refills | Status: DC

## 2021-09-24 NOTE — Discharge Instructions (Signed)

## 2021-09-24 NOTE — ED Provider Notes (Signed)
?[facility] ? ? ?MRN: [id number] DOB: [birth year] ? ?Subjective:  ? ?Diamond [name] is a 56 y.o. female presenting for several week history of persistent right knee pain.  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  It is especially worse when she has to do a lot of standing or walking.  No trauma, falls, bruising, swelling, redness, warmth.  No history of gout.  No history of arthritis. ? ?No current facility-administered medications for this encounter. ? ?Current Outpatient Medications:  ?  hydrochlorothiazide (HYDRODIURIL) 25 MG tablet, TAKE ONE TABLET (25MG  TOTAL) BY MOUTH DAILY, Disp: 30 tablet, Rfl: 3 ?  hydrOXYzine (VISTARIL) 25 MG capsule, Take 1 capsule (25 mg total) by mouth 3 (three) times daily as needed., Disp: 30 capsule, Rfl: 1 ?  PARoxetine (PAXIL) 10 MG tablet, TAKE ONE (1) TABLET BY MOUTH EVERY DAY, Disp: 30 tablet, Rfl: 3  ? ?Allergies  ?Allergen Reactions  ? Entex Lq [Phenylephrine-Guaifenesin]   ? Keflex [Cephalexin]   ? Organidin [Glycerol, Iodinated]   ? Wellbutrin [Bupropion]   ? ? ?Past Medical History:  ?Diagnosis Date  ? Anxiety 12/07/2014  ? Hypertension   ? Mental disorder   ? Mild depression 12/07/2014  ?  ? ?History reviewed. No pertinent surgical history. ? ?Family History  ?Problem Relation Age of Onset  ? Diabetes Paternal Grandmother   ? Stroke Paternal Grandfather   ? Colon cancer Neg Hx   ? Colon polyps Neg Hx   ? ? ?Social History  ? ?Tobacco Use  ? Smoking status: Former  ?  Types: E-cigarettes  ?  Quit date: 06/11/2017  ?  Years since quitting: 4.2  ? Smokeless tobacco: Never  ?Vaping Use  ? Vaping Use: Never used  ?Substance Use Topics  ? Alcohol use: Yes  ?  Comment: Patient reports 2 glasses of wine per night for the past couple of years.  ? Drug use: No  ? ? ?ROS ? ? ?Objective:  ? ?Vitals: ?BP (!) 157/101 (BP Location: Right Arm)   Pulse 96   Temp 98.6 ?F (37 ?C) (Oral)   Resp 18   LMP 11/08/2014    SpO2 96%  ? ?Physical Exam ?Constitutional:   ?   General: She is not in acute distress. ?   Appearance: Normal appearance. She is well-developed. She is not ill-appearing, toxic-appearing or diaphoretic.  ?HENT:  ?   Head: Normocephalic and atraumatic.  ?   Nose: Nose normal.  ?   Mouth/Throat:  ?   Mouth: Mucous membranes are moist.  ?Eyes:  ?   General: No scleral icterus.    ?   Right eye: No discharge.     ?   Left eye: No discharge.  ?   Extraocular Movements: Extraocular movements intact.  ?Cardiovascular:  ?   Rate and Rhythm: Normal rate.  ?Pulmonary:  ?   Effort: Pulmonary effort is normal.  ?Musculoskeletal:  ?   Right knee: Bony tenderness present. No swelling, deformity, effusion, erythema, ecchymosis, lacerations or crepitus. Normal range of motion. Tenderness (mild) present over the medial joint line and lateral joint line. No patellar tendon tenderness. Normal alignment and normal patellar mobility.  ?Skin: ?   General: Skin is warm and dry.  ?Neurological:  ?   General: No focal deficit present.  ?   Mental Status: She is alert and oriented to person, place, and time.  ?Psychiatric:     ?  Mood and Affect: Mood normal.     ?   Behavior: Behavior normal.  ? ? ?DG Knee Complete 4 Views Right ? ?Result Date: 09/24/2021 ?CLINICAL DATA:  Knee pain for 2 weeks. EXAM: RIGHT KNEE - COMPLETE 4+ VIEW COMPARISON:  None. FINDINGS: Four views study. No evidence of fracture, dislocation, or joint effusion. No evidence of arthropathy or other focal bone abnormality. Soft tissues are unremarkable. IMPRESSION: Negative. Electronically Signed   By: Kennith Center M.D.   On: 09/24/2021 08:45   ? ? ?Assessment and Plan :  ? ?PDMP not reviewed this encounter. ? ?1. Acute pain of right knee   ?2. Essential hypertension   ? ?Recommended an oral prednisone course to help with her inflammatory right knee pain.  I applied an Ace wrap to the right knee.  Recommended follow-up with Dr. Romeo Apple should her symptoms persist.  Counseled patient on potential for adverse effects with medications prescribed/recommended today, ER and return-to-clinic precautions discussed, patient verbalized understanding. ? ?  ?Wallis Bamberg, PA-C ?09/24/21 [id number] ? ?

## 2021-09-24 NOTE — ED Triage Notes (Signed)
Pt reports, pain in right knee. 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 Voltaren, knee sleeve and ice gives some relief/ Denies any injury.  ?

## 2021-11-02 DIAGNOSIS — H2513 Age-related nuclear cataract, bilateral: Secondary | ICD-10-CM | POA: Insufficient documentation

## 2021-11-07 ENCOUNTER — Other Ambulatory Visit: Payer: Self-pay

## 2021-11-07 ENCOUNTER — Telehealth: Payer: Self-pay

## 2021-11-07 MED ORDER — HYDROXYZINE PAMOATE 25 MG PO CAPS: 25.0000 mg | ORAL_CAPSULE | Freq: Three times a day (TID) | ORAL | 1 refills | Status: DC | PRN

## 2021-11-07 MED ORDER — PAROXETINE HCL 10 MG PO TABS: ORAL_TABLET | ORAL | 3 refills | Status: DC

## 2021-11-07 NOTE — Telephone Encounter (Signed)
PT CALLED AND STATED THAT SHE HAS SCHEDULED AN APPOINTMENT FOR December 15, 2021.  PT WOULD LIKE TO KNOW THAT SINCE SHE HAS MADE AN APPOINTMENT COULD SHE GET HER PAROXETINE AND HYDROCHLOROTHIAZIDE REFILLED. ?

## 2021-11-08 ENCOUNTER — Other Ambulatory Visit: Payer: Self-pay | Admitting: Adult Health

## 2021-12-15 ENCOUNTER — Ambulatory Visit: Payer: BC Managed Care – PPO | Admitting: Adult Health

## 2022-03-09 ENCOUNTER — Other Ambulatory Visit: Payer: Self-pay | Admitting: Adult Health

## 2022-04-10 ENCOUNTER — Other Ambulatory Visit: Payer: Self-pay | Admitting: Adult Health

## 2022-04-24 ENCOUNTER — Ambulatory Visit: Payer: BC Managed Care – PPO | Admitting: Adult Health

## 2022-04-24 DIAGNOSIS — Z0289 Encounter for other administrative examinations: Secondary | ICD-10-CM

## 2022-07-11 ENCOUNTER — Other Ambulatory Visit: Payer: Self-pay | Admitting: Adult Health

## 2022-08-22 ENCOUNTER — Other Ambulatory Visit: Payer: Self-pay | Admitting: *Deleted

## 2022-08-23 MED ORDER — PAROXETINE HCL 10 MG PO TABS
ORAL_TABLET | ORAL | 3 refills | Status: DC
Start: 2022-08-23 — End: 2022-12-25

## 2022-11-07 ENCOUNTER — Other Ambulatory Visit: Payer: Self-pay | Admitting: Adult Health

## 2022-12-25 ENCOUNTER — Other Ambulatory Visit: Payer: Self-pay | Admitting: Adult Health

## 2022-12-25 ENCOUNTER — Telehealth: Payer: Self-pay

## 2022-12-25 NOTE — Telephone Encounter (Signed)
Called patient to let her know that [facility] sent her in 3 refills today but she needs to make an appointment for her annual before she will send in another refill.

## 2023-03-12 ENCOUNTER — Other Ambulatory Visit: Payer: Self-pay | Admitting: Adult Health

## 2023-04-18 ENCOUNTER — Other Ambulatory Visit: Payer: Self-pay

## 2023-04-18 ENCOUNTER — Ambulatory Visit: Payer: BC Managed Care – PPO | Admitting: Adult Health

## 2023-04-18 ENCOUNTER — Encounter: Payer: Self-pay | Admitting: Adult Health

## 2023-04-18 ENCOUNTER — Other Ambulatory Visit (HOSPITAL_COMMUNITY)
Admission: RE | Admit: 2023-04-18 | Discharge: 2023-04-18 | Disposition: A | Discharge status: Home / Self Care | Payer: BC Managed Care – PPO | Source: Ambulatory Visit | Attending: Adult Health | Admitting: Adult Health

## 2023-04-18 VITALS — BP 158/95 | HR 87 | Ht 67.5 in | Wt 176.5 lb

## 2023-04-18 DIAGNOSIS — Z1231 Encounter for screening mammogram for malignant neoplasm of breast: Secondary | ICD-10-CM | POA: Diagnosis not present

## 2023-04-18 DIAGNOSIS — F419 Anxiety disorder, unspecified: Secondary | ICD-10-CM

## 2023-04-18 DIAGNOSIS — I10 Essential (primary) hypertension: Secondary | ICD-10-CM

## 2023-04-18 DIAGNOSIS — G479 Sleep disorder, unspecified: Secondary | ICD-10-CM | POA: Insufficient documentation

## 2023-04-18 DIAGNOSIS — Z01419 Encounter for gynecological examination (general) (routine) without abnormal findings: Secondary | ICD-10-CM | POA: Insufficient documentation

## 2023-04-18 DIAGNOSIS — Z1211 Encounter for screening for malignant neoplasm of colon: Secondary | ICD-10-CM | POA: Diagnosis not present

## 2023-04-18 DIAGNOSIS — Z1212 Encounter for screening for malignant neoplasm of rectum: Secondary | ICD-10-CM

## 2023-04-18 DIAGNOSIS — Z1339 Encounter for screening examination for other mental health and behavioral disorders: Secondary | ICD-10-CM

## 2023-04-18 DIAGNOSIS — Z13220 Encounter for screening for lipoid disorders: Secondary | ICD-10-CM

## 2023-04-18 DIAGNOSIS — Z1329 Encounter for screening for other suspected endocrine disorder: Secondary | ICD-10-CM

## 2023-04-18 LAB — HEMOCCULT GUIAC POC 1CARD (OFFICE): Fecal Occult Blood, POC: NEGATIVE

## 2023-04-18 MED ORDER — LISINOPRIL 10 MG PO TABS: 10.0000 mg | ORAL_TABLET | Freq: Every day | ORAL | 6 refills | Status: DC

## 2023-04-18 MED ORDER — PAROXETINE HCL 10 MG PO TABS: ORAL_TABLET | ORAL | 12 refills | Status: DC

## 2023-04-18 MED ORDER — HYDROCHLOROTHIAZIDE 25 MG PO TABS: ORAL_TABLET | ORAL | 12 refills | Status: DC

## 2023-04-18 MED ORDER — TRAZODONE HCL 50 MG PO TABS: 50.0000 mg | ORAL_TABLET | Freq: Every day | ORAL | 3 refills | Status: AC

## 2023-04-18 NOTE — Progress Notes (Signed)
Patient ID: Diamond [name], female   DOB: [birth year], 57 y.o.   MRN: [id number] History of Present Illness: Diamond [name] is a 57 year old white female, widowed, PM in for a well woman gyn exam and pap. She is not sleeping well, has tried OTC magnesium and melatonin, without any relief. 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   PCP is Dr Phillips Odor  Current Medications, Allergies, Past Medical History, Past Surgical History, Family History and Social History were reviewed in [employer] [facility] electronic medical record.     Review of Systems: Patient denies any headaches, hearing loss, fatigue, blurred vision, shortness of breath, chest pain, abdominal pain, problems with bowel movements, urination, or intercourse. No joint pain or mood swings.  Denies any vaginal bleeding Not sleeping well    Physical Exam:BP (!) 158/95 (BP Location: Right Arm, Patient Position: Sitting, Cuff Size: Normal)   Pulse 87   Ht 5' 7.5" (1.715 m)   Wt 176 lb 8 oz (80.1 kg)   LMP 11/08/2014   BMI 27.24 kg/m   General:  Well developed, well nourished, no acute distress Skin:  Warm and dry Neck:  Midline trachea, normal thyroid, good ROM, no lymphadenopathy Lungs; Clear to auscultation bilaterally Breast:  No dominant palpable mass, retraction, or nipple discharge Cardiovascular: Regular rate and rhythm Abdomen:  Soft, non tender, no hepatosplenomegaly Pelvic:  External genitalia is normal in appearance, no lesions.  The vagina is pale. Urethra has no lesions or masses. The cervix is smooth, pap with HR HPV genotyping performed.  Uterus is felt to be normal size, shape, and contour.  No adnexal masses or tenderness noted.Bladder is non tender, no masses felt. Rectal: Good sphincter tone, no polyps, or hemorrhoids felt.  Hemoccult negative. Extremities/musculoskeletal:  No swelling or varicosities noted, no clubbing or cyanosis Psych:  No mood changes, alert and cooperative,seems happy AA is 2 Fall risk is  low    04/18/2023    1:38 PM 03/12/2020    9:55 AM 01/23/2018   10:08 AM  Depression screen PHQ 2/9  Decreased Interest 0 1 0  Down, Depressed, Hopeless 0 1 0  PHQ - 2 Score 0 2 0  Altered sleeping 2 3 0  Tired, decreased energy 0 1 0  Change in appetite 0 0 0  Feeling bad or failure about yourself  0 0 0  Trouble concentrating 1 0 0  Moving slowly or fidgety/restless 0 0 0  Suicidal thoughts 0 0 0  PHQ-9 Score 3 6 0  Difficult doing work/chores  Somewhat difficult Not difficult at all       04/18/2023    1:38 PM 03/12/2020    9:56 AM  GAD 7 : Generalized Anxiety Score  Nervous, Anxious, on Edge 1 1  Control/stop worrying 1 3  Worry too much - different things 1 3  Trouble relaxing 0 0  Restless 1 0  Easily annoyed or irritable 1 0  Afraid - awful might happen 0 0  Total GAD 7 Score 5 7  Anxiety Difficulty  Somewhat difficult      Upstream - 04/18/23 1337       Pregnancy Intention Screening   Does the patient want to become pregnant in the next year? N/A    Does the patient's partner want to become pregnant in the next year? N/A    Would the patient like to discuss contraceptive options today? N/A      Contraception Wrap Up   Current Method  Abstinence   PM   End Method Abstinence   PM   Contraception Counseling Provided No             Examination chaperoned by Diamond Scearce NP student  Impression and Plan: 1. Encounter for gynecological examination with Papanicolaou smear of cervix Pap sent Pap in 3 years if normal Physical in 1 year Will check labs  - Cytology - PAP( [place]) - CBC - Comprehensive metabolic panel - Lipid panel  2. Encounter for screening fecal occult blood testing Hemoccult was negative  - POCT occult blood stool  3. Screening mammogram for breast cancer Mammogram  scheduled for her 04/25/23 at 7:30 am at [facility] - MM 3D SCREENING MAMMOGRAM BILATERAL BREAST; Future  4. Anxiety On paxil 10 mg will continue and  refill Meds ordered this encounter  Medications   traZODone (DESYREL) 50 MG tablet    Sig: Take 1 tablet (50 mg total) by mouth at bedtime.    Dispense:  30 tablet    Refill:  3    Order Specific Question:   Supervising Provider    Answer:   Despina Hidden, LUTHER H [[id number]]   lisinopril (ZESTRIL) 10 MG tablet    Sig: Take 1 tablet (10 mg total) by mouth daily.    Dispense:  30 tablet    Refill:  6    Order Specific Question:   Supervising Provider    Answer:   Duane Lope H [[id number]]   hydrochlorothiazide (HYDRODIURIL) 25 MG tablet    Sig: TAKE ONE TABLET (25MG  TOTAL) BY MOUTH DAILY    Dispense:  30 tablet    Refill:  12    Order Specific Question:   Supervising Provider    Answer:   Duane Lope H [[id number]]   PARoxetine (PAXIL) 10 MG tablet    Sig: TAKE ONE (1) TABLET BY MOUTH EVERY DAY    Dispense:  30 tablet    Refill:  12    Order Specific Question:   Supervising Provider    Answer:   Despina Hidden, LUTHER H [[id number]]      5. Encounter for colorectal cancer screening Will order cologuard - Cologuard  6. Screening cholesterol level - Lipid panel  7. Screening for thyroid disorder - TSH + free T4  8. Essential hypertension Will refill hydrodiuril 25 mg daily and add lisinopril 10 mg 1 daily  Continue walking and will recheck BP in 8  weeks   9. Sleep disturbance Not sleeping  Will add trazodone 50 mg 1 at HS to see if helps  Follow up in 8 weeks for ROS

## 2023-04-23 LAB — CYTOLOGY - PAP
Comment: NEGATIVE
Diagnosis: NEGATIVE
High risk HPV: NEGATIVE

## 2023-04-25 ENCOUNTER — Ambulatory Visit (HOSPITAL_COMMUNITY)
Admission: RE | Admit: 2023-04-25 | Discharge: 2023-04-25 | Disposition: A | Discharge status: Home / Self Care | Payer: BC Managed Care – PPO | Source: Ambulatory Visit | Attending: Adult Health | Admitting: Adult Health

## 2023-04-25 DIAGNOSIS — Z1231 Encounter for screening mammogram for malignant neoplasm of breast: Secondary | ICD-10-CM | POA: Insufficient documentation

## 2023-04-27 ENCOUNTER — Other Ambulatory Visit (HOSPITAL_COMMUNITY): Payer: Self-pay | Admitting: Adult Health

## 2023-04-27 DIAGNOSIS — R928 Other abnormal and inconclusive findings on diagnostic imaging of breast: Secondary | ICD-10-CM

## 2023-05-29 ENCOUNTER — Ambulatory Visit (HOSPITAL_COMMUNITY)
Admission: RE | Admit: 2023-05-29 | Discharge: 2023-05-29 | Disposition: A | Discharge status: Home / Self Care | Payer: BC Managed Care – PPO | Source: Ambulatory Visit | Attending: Adult Health | Admitting: Adult Health

## 2023-05-29 ENCOUNTER — Encounter (HOSPITAL_COMMUNITY): Payer: Self-pay

## 2023-05-29 ENCOUNTER — Other Ambulatory Visit (HOSPITAL_COMMUNITY): Payer: Self-pay | Admitting: Adult Health

## 2023-05-29 DIAGNOSIS — R928 Other abnormal and inconclusive findings on diagnostic imaging of breast: Secondary | ICD-10-CM | POA: Diagnosis present

## 2023-06-05 ENCOUNTER — Ambulatory Visit (HOSPITAL_COMMUNITY)
Admission: RE | Admit: 2023-06-05 | Discharge: 2023-06-05 | Disposition: A | Discharge status: Home / Self Care | Payer: BC Managed Care – PPO | Source: Ambulatory Visit | Attending: Adult Health | Admitting: Adult Health

## 2023-06-05 DIAGNOSIS — R928 Other abnormal and inconclusive findings on diagnostic imaging of breast: Secondary | ICD-10-CM

## 2023-06-05 HISTORY — PX: BREAST BIOPSY: SHX20

## 2023-06-05 MED ORDER — LIDOCAINE HCL (PF) 2 % IJ SOLN
INTRAMUSCULAR | Status: AC
  Filled 2023-06-05: qty 10

## 2023-06-05 MED ORDER — LIDOCAINE HCL (PF) 2 % IJ SOLN
10.0000 mL | Freq: Once | INTRAMUSCULAR | Status: AC
  Administered 2023-06-05: 10 mL

## 2023-06-05 MED ORDER — LIDOCAINE-EPINEPHRINE (PF) 1 %-1:200000 IJ SOLN
INTRAMUSCULAR | Status: AC
Start: 2023-06-05 — End: ?
  Filled 2023-06-05: qty 30

## 2023-06-05 MED ORDER — LIDOCAINE-EPINEPHRINE (PF) 1 %-1:200000 IJ SOLN
10.0000 mL | Freq: Once | INTRAMUSCULAR | Status: AC
Start: 2023-06-05 — End: 2023-06-05
  Administered 2023-06-05: 10 mL via INTRADERMAL

## 2023-06-05 NOTE — Progress Notes (Signed)
PT tolerated left breast biopsy well today with NAD noted. PT verbalized understanding of discharge instructions. PT ambulated back to the mammogram area this time and given ice packs. Specimens taken to lab at this time by [facility] from ultrasound (2130).

## 2023-06-06 LAB — SURGICAL PATHOLOGY

## 2023-06-13 ENCOUNTER — Ambulatory Visit: Payer: BC Managed Care – PPO | Admitting: Adult Health

## 2023-11-20 ENCOUNTER — Other Ambulatory Visit: Payer: Self-pay | Admitting: Adult Health

## 2024-05-12 ENCOUNTER — Other Ambulatory Visit: Payer: Self-pay | Admitting: Adult Health

## 2024-05-16 ENCOUNTER — Other Ambulatory Visit: Payer: Self-pay | Admitting: Adult Health

## 2024-06-27 ENCOUNTER — Other Ambulatory Visit: Payer: Self-pay | Admitting: Adult Health
